# Patient Record
Sex: Female | Born: 1990 | Hispanic: Yes | Marital: Married | Smoking: Never smoker
Health system: Southern US, Community
[De-identification: ages and names within clinical notes are randomized; demographics above are authoritative.]

## PROBLEM LIST (undated history)

## (undated) DIAGNOSIS — Z789 Other specified health status: Secondary | ICD-10-CM

## (undated) HISTORY — PX: NO PAST SURGERIES: SHX2092

---
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
## 2012-12-16 ENCOUNTER — Ambulatory Visit: Payer: Self-pay | Admitting: Primary Care

## 2013-05-22 ENCOUNTER — Inpatient Hospital Stay: Payer: Self-pay | Admitting: Obstetrics and Gynecology

## 2013-05-22 LAB — CBC WITH DIFFERENTIAL/PLATELET
BASOS ABS: 0.1 10*3/uL (ref 0.0–0.1)
Basophil %: 0.5 %
EOS PCT: 0.1 %
Eosinophil #: 0 10*3/uL (ref 0.0–0.7)
HCT: 36 % (ref 35.0–47.0)
HGB: 11.8 g/dL — AB (ref 12.0–16.0)
LYMPHS ABS: 2.3 10*3/uL (ref 1.0–3.6)
Lymphocyte %: 20.7 %
MCH: 29.8 pg (ref 26.0–34.0)
MCHC: 32.7 g/dL (ref 32.0–36.0)
MCV: 91 fL (ref 80–100)
MONO ABS: 1 x10 3/mm — AB (ref 0.2–0.9)
MONOS PCT: 8.7 %
Neutrophil #: 7.9 10*3/uL — ABNORMAL HIGH (ref 1.4–6.5)
Neutrophil %: 70 %
PLATELETS: 230 10*3/uL (ref 150–440)
RBC: 3.95 10*6/uL (ref 3.80–5.20)
RDW: 14.9 % — ABNORMAL HIGH (ref 11.5–14.5)
WBC: 11.2 10*3/uL — ABNORMAL HIGH (ref 3.6–11.0)

## 2013-05-23 LAB — GC/CHLAMYDIA PROBE AMP

## 2013-05-24 LAB — HEMOGLOBIN: HGB: 9.6 g/dL — ABNORMAL LOW (ref 12.0–16.0)

## 2018-02-16 NOTE — L&D Delivery Note (Signed)
Delivery Note At 12:34 AM a viable and healthy female "Early Chars" was delivered via Vaginal, Spontaneous (Presentation: LOA  ).  APGAR: 8,9 ; weight pending .   Placenta status: spontaneous.  Cord: 3VC  with the following complications: none  Anesthesia:  None Episiotomy: None Lacerations: None Suture Repair: n/a Est. Blood Loss (mL):  100  Mom to postpartum.  Baby to Couplet care / Skin to Skin.  28yo [id number] at 38+2wks presented in active labor. She progressed to fully dilated after AROM for clear fluid and pushed over an intact perineum. She delivered a small baby who was immediately vigorous and was placed on maternal abdomen. Delayed cord clamping with a 3 vc. Placenta delivered intact. Bleeding minimal. No tears. Fundus firm. Baby and mom tolerated the procedure well.  Of note, we had no prenatal records on admission and by delivery. GBS rapid negative  Benjaman Kindler 11/15/2018, 12:44 AM

## 2018-04-13 ENCOUNTER — Other Ambulatory Visit: Payer: Self-pay | Admitting: Primary Care

## 2018-04-13 DIAGNOSIS — Z3201 Encounter for pregnancy test, result positive: Secondary | ICD-10-CM

## 2018-04-21 ENCOUNTER — Ambulatory Visit
Admission: RE | Admit: 2018-04-21 | Discharge: 2018-04-21 | Disposition: A | Discharge status: Home / Self Care | Payer: 59 | Source: Ambulatory Visit | Attending: Primary Care | Admitting: Primary Care

## 2018-04-21 ENCOUNTER — Ambulatory Visit: Payer: Self-pay

## 2018-04-21 DIAGNOSIS — Z3201 Encounter for pregnancy test, result positive: Secondary | ICD-10-CM | POA: Diagnosis present

## 2018-06-21 ENCOUNTER — Other Ambulatory Visit: Payer: Self-pay | Admitting: Primary Care

## 2018-06-21 DIAGNOSIS — Z3482 Encounter for supervision of other normal pregnancy, second trimester: Secondary | ICD-10-CM

## 2018-07-05 ENCOUNTER — Ambulatory Visit: Payer: 59

## 2018-07-07 ENCOUNTER — Ambulatory Visit
Admission: RE | Admit: 2018-07-07 | Discharge: 2018-07-07 | Disposition: A | Discharge status: Home / Self Care | Payer: 59 | Source: Ambulatory Visit | Attending: Primary Care | Admitting: Primary Care

## 2018-07-07 ENCOUNTER — Other Ambulatory Visit: Payer: Self-pay

## 2018-07-07 ENCOUNTER — Ambulatory Visit: Payer: 59

## 2018-07-07 DIAGNOSIS — Z3482 Encounter for supervision of other normal pregnancy, second trimester: Secondary | ICD-10-CM

## 2018-07-08 ENCOUNTER — Encounter: Payer: Self-pay | Admitting: Intensive Care

## 2018-07-08 ENCOUNTER — Emergency Department: Payer: 59

## 2018-07-08 ENCOUNTER — Other Ambulatory Visit: Payer: Self-pay

## 2018-07-08 ENCOUNTER — Emergency Department
Admission: EM | Admit: 2018-07-08 | Discharge: 2018-07-08 | Disposition: A | Discharge status: Home / Self Care | Payer: 59 | Attending: Student in an Organized Health Care Education/Training Program | Admitting: Student in an Organized Health Care Education/Training Program

## 2018-07-08 DIAGNOSIS — Z3A19 19 weeks gestation of pregnancy: Secondary | ICD-10-CM | POA: Insufficient documentation

## 2018-07-08 DIAGNOSIS — O9989 Other specified diseases and conditions complicating pregnancy, childbirth and the puerperium: Secondary | ICD-10-CM | POA: Insufficient documentation

## 2018-07-08 DIAGNOSIS — K8020 Calculus of gallbladder without cholecystitis without obstruction: Secondary | ICD-10-CM | POA: Diagnosis not present

## 2018-07-08 DIAGNOSIS — R1013 Epigastric pain: Secondary | ICD-10-CM | POA: Insufficient documentation

## 2018-07-08 LAB — COMPREHENSIVE METABOLIC PANEL
ALT: 19 U/L (ref 0–44)
AST: 21 U/L (ref 15–41)
Albumin: 3.9 g/dL (ref 3.5–5.0)
Alkaline Phosphatase: 79 U/L (ref 38–126)
Anion gap: 9 (ref 5–15)
BUN: 6 mg/dL (ref 6–20)
CO2: 24 mmol/L (ref 22–32)
Calcium: 9 mg/dL (ref 8.9–10.3)
Chloride: 102 mmol/L (ref 98–111)
Creatinine, Ser: 0.45 mg/dL (ref 0.44–1.00)
GFR calc Af Amer: >60 mL/min (ref >60)
GFR calc non Af Amer: >60 mL/min (ref >60)
Glucose, Bld: 93 mg/dL (ref 70–99)
Potassium: 3.6 mmol/L (ref 3.5–5.1)
Sodium: 135 mmol/L (ref 135–145)
Total Bilirubin: 0.3 mg/dL (ref 0.3–1.2)
Total Protein: 8.1 g/dL (ref 6.5–8.1)

## 2018-07-08 LAB — CBC
HCT: 36.3 % (ref 36.0–46.0)
Hemoglobin: 12.3 g/dL (ref 12.0–15.0)
MCH: 30 pg (ref 26.0–34.0)
MCHC: 33.9 g/dL (ref 30.0–36.0)
MCV: 88.5 fL (ref 80.0–100.0)
Platelets: 324 10*3/uL (ref 150–400)
RBC: 4.1 MIL/uL (ref 3.87–5.11)
RDW: 14.7 % (ref 11.5–15.5)
WBC: 15.7 10*3/uL — ABNORMAL HIGH (ref 4.0–10.5)
nRBC: 0 % (ref 0.0–0.2)

## 2018-07-08 LAB — URINALYSIS, COMPLETE (UACMP) WITH MICROSCOPIC
Bacteria, UA: NONE SEEN
Bilirubin Urine: NEGATIVE
Glucose, UA: NEGATIVE mg/dL
Hgb urine dipstick: NEGATIVE
Ketones, ur: 20 mg/dL — AB
Nitrite: NEGATIVE
Protein, ur: NEGATIVE mg/dL
Specific Gravity, Urine: 1.005 (ref 1.005–1.030)
pH: 7 (ref 5.0–8.0)

## 2018-07-08 LAB — LIPASE, BLOOD: Lipase: 48 U/L (ref 11–51)

## 2018-07-08 MED ORDER — SODIUM CHLORIDE 0.9 % IV BOLUS: 1000.0000 mL | Freq: Once | INTRAVENOUS | Status: DC

## 2018-07-08 MED ORDER — CEPHALEXIN 500 MG PO CAPS
500.0000 mg | ORAL_CAPSULE | Freq: Once | ORAL | Status: AC
  Administered 2018-07-08: 500 mg via ORAL
  Filled 2018-07-08: qty 1

## 2018-07-08 MED ORDER — CEPHALEXIN 500 MG PO CAPS: 500.0000 mg | ORAL_CAPSULE | Freq: Three times a day (TID) | ORAL | 0 refills | Status: AC

## 2018-07-08 NOTE — ED Provider Notes (Signed)
[facility] Emergency Department Provider Note    First MD Initiated Contact with Patient 07/08/18 1809     (approximate)  I have reviewed the triage vital signs and the nursing notes.   HISTORY  Chief Complaint Abdominal Pain and Near Syncope    HPI Lacey Clark is a 28 y.o. female roughly 5 months pregnant presents the ER for epigastric pain as well as near syncopal event that occurred while she was at work today.  States she felt like her heart was racing but did not fully lose consciousness.  Was complaining of epigastric discomfort and nausea.  Denies any shortness of breath.  No pain right now but has been having some nausea with meals over the past few days.  Denies any history of gastritis or heartburn.  Was also concerned because she felt the baby was not moving as much today.  Denies any dysuria or flank pain.    History reviewed. No pertinent past medical history. History reviewed. No pertinent family history. History reviewed. No pertinent surgical history. There are no active problems to display for this patient.     Prior to Admission medications   Medication Sig Start Date End Date Taking? Authorizing Provider  cephALEXin (KEFLEX) 500 MG capsule Take 1 capsule (500 mg total) by mouth 3 (three) times daily for 5 days. 07/08/18 07/13/18  Willy Eddy, MD    Allergies Patient has no known allergies.    Social History Social History   Tobacco Use  . Smoking status: Never Smoker  . Smokeless tobacco: Never Used  Substance Use Topics  . Alcohol use: Never    Frequency: Never  . Drug use: Never    Review of Systems Patient denies headaches, rhinorrhea, blurry vision, numbness, shortness of breath, chest pain, edema, cough, abdominal pain, nausea, vomiting, diarrhea, dysuria, fevers, rashes or hallucinations unless otherwise stated above in HPI. ____________________________________________   PHYSICAL EXAM:  VITAL  SIGNS: Vitals:   07/08/18 1830 07/08/18 1933  BP: 105/66 101/63  Pulse: 84 79  Resp: (!) 22 20  Temp:    SpO2: 100% 100%    Constitutional: Alert and oriented.  Eyes: Conjunctivae are normal.  Head: Atraumatic. Nose: No congestion/rhinnorhea. Mouth/Throat: Mucous membranes are moist.   Neck: No stridor. Painless ROM.  Cardiovascular: Normal rate, regular rhythm. Grossly normal heart sounds.  Good peripheral circulation. Respiratory: Normal respiratory effort.  No retractions. Lungs CTAB. Gastrointestinal: Soft and nontender gravid. No distention. No abdominal bruits. No CVA tenderness. Genitourinary:  Musculoskeletal: No lower extremity tenderness nor edema.  No joint effusions. Neurologic:  Normal speech and language. No gross focal neurologic deficits are appreciated. No facial droop Skin:  Skin is warm, dry and intact. No rash noted. Psychiatric: Mood and affect are normal. Speech and behavior are normal.  ____________________________________________   LABS (all labs ordered are listed, but only abnormal results are displayed)  Results for orders placed or performed during the hospital encounter of 07/08/18 (from the past 24 hour(s))  Lipase, blood     Status: None   Collection Time: 07/08/18  4:45 PM  Result Value Ref Range   Lipase 48 11 - 51 U/L  Comprehensive metabolic panel     Status: None   Collection Time: 07/08/18  4:45 PM  Result Value Ref Range   Sodium 135 135 - 145 mmol/L   Potassium 3.6 3.5 - 5.1 mmol/L   Chloride 102 98 - 111 mmol/L   CO2 24 22 - 32 mmol/L  Glucose, Bld 93 70 - 99 mg/dL   BUN 6 6 - 20 mg/dL   Creatinine, Ser 1.61 0.44 - 1.00 mg/dL   Calcium 9.0 8.9 - 09.6 mg/dL   Total Protein 8.1 6.5 - 8.1 g/dL   Albumin 3.9 3.5 - 5.0 g/dL   AST 21 15 - 41 U/L   ALT 19 0 - 44 U/L   Alkaline Phosphatase 79 38 - 126 U/L   Total Bilirubin 0.3 0.3 - 1.2 mg/dL   GFR calc non Af Amer >60 >60 mL/min   GFR calc Af Amer >60 >60 mL/min   Anion gap 9 5 -  15  CBC     Status: Abnormal   Collection Time: 07/08/18  4:45 PM  Result Value Ref Range   WBC 15.7 (H) 4.0 - 10.5 K/uL   RBC 4.10 3.87 - 5.11 MIL/uL   Hemoglobin 12.3 12.0 - 15.0 g/dL   HCT 04.5 40.9 - 81.1 %   MCV 88.5 80.0 - 100.0 fL   MCH 30.0 26.0 - 34.0 pg   MCHC 33.9 30.0 - 36.0 g/dL   RDW 91.4 78.2 - 95.6 %   Platelets 324 150 - 400 K/uL   nRBC 0.0 0.0 - 0.2 %  Urinalysis, Complete w Microscopic     Status: Abnormal   Collection Time: 07/08/18  7:34 PM  Result Value Ref Range   Color, Urine YELLOW (A) YELLOW   APPearance HAZY (A) CLEAR   Specific Gravity, Urine 1.005 1.005 - 1.030   pH 7.0 5.0 - 8.0   Glucose, UA NEGATIVE NEGATIVE mg/dL   Hgb urine dipstick NEGATIVE NEGATIVE   Bilirubin Urine NEGATIVE NEGATIVE   Ketones, ur 20 (A) NEGATIVE mg/dL   Protein, ur NEGATIVE NEGATIVE mg/dL   Nitrite NEGATIVE NEGATIVE   Leukocytes,Ua LARGE (A) NEGATIVE   RBC / HPF 0-5 0 - 5 RBC/hpf   WBC, UA 11-20 0 - 5 WBC/hpf   Bacteria, UA NONE SEEN NONE SEEN   Squamous Epithelial / LPF 11-20 0 - 5   ____________________________________________  EKG My review and personal interpretation at Time: 18:34   Indication: epigastric pain  Rate: 70  Rhythm: sinus Axis: normal Other: normal intervals, no stemi ____________________________________________  RADIOLOGY  I personally reviewed all radiographic images ordered to evaluate for the above acute complaints and reviewed radiology reports and findings.  These findings were personally discussed with the patient.  Please see medical record for radiology report.  EMERGENCY DEPARTMENT [place] PREGNANCY "Study: Limited Ultrasound of the Pelvis for Pregnancy"  INDICATIONS:Pregnancy(required) and Abdominal or pelvic pain Multiple views of the uterus and pelvic cavity were obtained in real-time with a multi-frequency probe.  APPROACH:Transabdominal  PERFORMED BY: Myself IMAGES ARCHIVED?: No LIMITATIONS: none PREGNANCY FREE FLUID: Present  ADNEXAL FINDINGS: GESTATIONAL AGE, ESTIMATE: 39mo FETAL HEART RATE: 150 INTERPRETATION: reassuring fetal US with reassuring fetal movement     ____________________________________________   PROCEDURES  Procedure(s) performed:  Procedures    Critical Care performed: no ____________________________________________   INITIAL IMPRESSION / ASSESSMENT AND PLAN / ED COURSE  Pertinent labs & imaging results that were available during my care of the patient were reviewed by me and considered in my medical decision making (see chart for details).   DDX: gastrit,s dehydration, dysrhythmia, enteritis, cholelithiasis, cholecystitis  Lacey Clark is a 28 y.o. who presents to the ED with symptoms as described above.  Reassuring exam.  Reassuring fetal heart tones and reassuring fetal movement the ultrasound.  EKG does not show any  evidence of dysrhythmia.  Will give little IV fluids might have a component of dehydration.  No right lower quadrant pain to suggest appendicitis.  Will check urine.  We will also order ultrasound to evaluate for acute cholelithiasis or cholecystitis.  Clinical Course as of Jul 08 2103  Fri Jul 08, 2018  2029 Ultrasound shows evidence of cholelithiasis but no evidence of cholecystitis.  Patient's symptoms improved.  Does have some pyuria therefore will starting Keflex.  Patient tolerating oral hydration.  Stable appropriate for outpatient follow-up.   [PR]    Clinical Course User Index [PR] Willy Eddyobinson, Della Homan, MD    The patient was evaluated in Emergency Department today for the symptoms described in the history of present illness. He/she was evaluated in the context of the global COVID-19 pandemic, which necessitated consideration that the patient might be at risk for infection with the SARS-CoV-2 virus that causes COVID-19. Institutional protocols and algorithms that pertain to the evaluation of patients at risk for COVID-19 are in a state of rapid change  based on information released by regulatory bodies including the CDC and federal and state organizations. These policies and algorithms were followed during the patient's care in the ED.  As part of my medical decision making, I reviewed the following data within the electronic MEDICAL RECORD NUMBER Nursing notes reviewed and incorporated, Labs reviewed, notes from prior ED visits and [place] Controlled Substance Database   ____________________________________________   FINAL CLINICAL IMPRESSION(S) / ED DIAGNOSES  Final diagnoses:  Epigastric pain  [redacted] weeks gestation of pregnancy  Calculus of gallbladder without cholecystitis without obstruction      NEW MEDICATIONS STARTED DURING THIS VISIT:  New Prescriptions   CEPHALEXIN (KEFLEX) 500 MG CAPSULE    Take 1 capsule (500 mg total) by mouth 3 (three) times daily for 5 days.     Note:  This document was prepared using Dragon voice recognition software and may include unintentional dictation errors.    Willy Eddyobinson, Jamine Highfill, MD 07/08/18 2105

## 2018-07-08 NOTE — Discharge Instructions (Addendum)

## 2018-07-08 NOTE — ED Notes (Signed)
Patient transported to Ultrasound 

## 2018-07-08 NOTE — ED Triage Notes (Signed)
First Nurse Note:  Arrives via [facility] for ED evaluation.  EMS reports that patient is [redacted] weeks pregnant.  C/O epigastric pain x 45 minutes also decreased fetal movement today.  Had [place] yesterday at [facility], no problems identified.  Wants to be checked out today for decreased fetal movement.  Patient is G3 P2.  Denies vaginal bleeding, trauma, abdominal pain.

## 2018-07-08 NOTE — ED Triage Notes (Signed)
Patient reports around 3pm she started feeling near syncope and abdominal pain while at work. After sitting for awhile she went to stand and the pain got more severe in her abdomen. Patient is 19 weeks and is seen by Leonette Most drew. Denies any discharge.

## 2018-07-10 LAB — URINE CULTURE

## 2018-10-18 ENCOUNTER — Encounter: Payer: Self-pay | Admitting: *Deleted

## 2018-10-18 ENCOUNTER — Observation Stay
Admission: EM | Admit: 2018-10-18 | Discharge: 2018-10-18 | Disposition: A | Discharge status: Home / Self Care | Payer: 59 | Attending: Obstetrics and Gynecology | Admitting: Obstetrics and Gynecology

## 2018-10-18 ENCOUNTER — Other Ambulatory Visit: Payer: Self-pay

## 2018-10-18 DIAGNOSIS — Z3A34 34 weeks gestation of pregnancy: Secondary | ICD-10-CM | POA: Insufficient documentation

## 2018-10-18 DIAGNOSIS — U071 COVID-19: Secondary | ICD-10-CM | POA: Insufficient documentation

## 2018-10-18 DIAGNOSIS — O98513 Other viral diseases complicating pregnancy, third trimester: Secondary | ICD-10-CM | POA: Diagnosis not present

## 2018-10-18 DIAGNOSIS — O26893 Other specified pregnancy related conditions, third trimester: Secondary | ICD-10-CM | POA: Diagnosis present

## 2018-10-18 DIAGNOSIS — Z3490 Encounter for supervision of normal pregnancy, unspecified, unspecified trimester: Secondary | ICD-10-CM

## 2018-10-18 DIAGNOSIS — R112 Nausea with vomiting, unspecified: Secondary | ICD-10-CM | POA: Diagnosis present

## 2018-10-18 LAB — SARS CORONAVIRUS 2 BY RT PCR (HOSPITAL ORDER, PERFORMED IN ~~LOC~~ HOSPITAL LAB): SARS Coronavirus 2: POSITIVE — AB

## 2018-10-18 LAB — CBC
HCT: 35.9 % — ABNORMAL LOW (ref 36.0–46.0)
Hemoglobin: 12.3 g/dL (ref 12.0–15.0)
MCH: 30.1 pg (ref 26.0–34.0)
MCHC: 34.3 g/dL (ref 30.0–36.0)
MCV: 87.8 fL (ref 80.0–100.0)
Platelets: 192 10*3/uL (ref 150–400)
RBC: 4.09 MIL/uL (ref 3.87–5.11)
RDW: 14.5 % (ref 11.5–15.5)
WBC: 7.1 10*3/uL (ref 4.0–10.5)
nRBC: 0 % (ref 0.0–0.2)

## 2018-10-18 LAB — URINALYSIS, COMPLETE (UACMP) WITH MICROSCOPIC
Bacteria, UA: NONE SEEN
Bilirubin Urine: NEGATIVE
Glucose, UA: NEGATIVE mg/dL
Hgb urine dipstick: NEGATIVE
Ketones, ur: 80 mg/dL — AB
Nitrite: NEGATIVE
Protein, ur: 30 mg/dL — AB
Specific Gravity, Urine: 1.019 (ref 1.005–1.030)
pH: 6 (ref 5.0–8.0)

## 2018-10-18 LAB — COMPREHENSIVE METABOLIC PANEL
ALT: 27 U/L (ref 0–44)
AST: 30 U/L (ref 15–41)
Albumin: 2.9 g/dL — ABNORMAL LOW (ref 3.5–5.0)
Alkaline Phosphatase: 128 U/L — ABNORMAL HIGH (ref 38–126)
Anion gap: 9 (ref 5–15)
BUN: 6 mg/dL (ref 6–20)
CO2: 21 mmol/L — ABNORMAL LOW (ref 22–32)
Calcium: 8.4 mg/dL — ABNORMAL LOW (ref 8.9–10.3)
Chloride: 104 mmol/L (ref 98–111)
Creatinine, Ser: 0.47 mg/dL (ref 0.44–1.00)
GFR calc Af Amer: >60 mL/min (ref >60)
GFR calc non Af Amer: >60 mL/min (ref >60)
Glucose, Bld: 92 mg/dL (ref 70–99)
Potassium: 3.6 mmol/L (ref 3.5–5.1)
Sodium: 134 mmol/L — ABNORMAL LOW (ref 135–145)
Total Bilirubin: 0.6 mg/dL (ref 0.3–1.2)
Total Protein: 6.4 g/dL — ABNORMAL LOW (ref 6.5–8.1)

## 2018-10-18 LAB — LIPASE, BLOOD: Lipase: 39 U/L (ref 11–51)

## 2018-10-18 MED ORDER — DEXTROSE 5 % AND 0.9 % NACL IV BOLUS
1000.0000 mL | Freq: Once | INTRAVENOUS | Status: AC
  Administered 2018-10-18: 1000 mL via INTRAVENOUS

## 2018-10-18 MED ORDER — ONDANSETRON HCL 4 MG PO TABS
4.0000 mg | ORAL_TABLET | Freq: Once | ORAL | Status: AC
  Administered 2018-10-18: 4 mg via ORAL
  Filled 2018-10-18: qty 1

## 2018-10-18 MED ORDER — DEXTROSE 5 % AND 0.9 % NACL IV BOLUS: 250.0000 mL | Freq: Once | INTRAVENOUS | Status: DC

## 2018-10-18 MED ORDER — ONDANSETRON 4 MG PO TBDP: 4.0000 mg | ORAL_TABLET | Freq: Four times a day (QID) | ORAL | 0 refills | Status: DC | PRN

## 2018-10-18 MED ORDER — ONDANSETRON 4 MG PO TBDP: 4.0000 mg | ORAL_TABLET | Freq: Four times a day (QID) | ORAL | Status: DC | PRN

## 2018-10-18 NOTE — ED Triage Notes (Signed)
Per patient's report, patient began vomiting after eating anything two days ago. Patient states she started to feel dizzy yesterday. Patient states she is [redacted] weeks pregnant and is feeling the baby move normally. Patient states she has had low iron with this pregnancy.

## 2018-10-18 NOTE — OB Triage Note (Signed)
Pt presents to the ED c/o N/V dizziness, and unable to keep food down since Sunday 9/30. Pt also states she has mild mid back pain and a cough that started on Monday 8/31. Pt denies vaginal bleeding, leaking of fluid, and states positive fetal movement. VS WNL. External monitors applied, initial FHT 155.

## 2018-10-18 NOTE — ED Notes (Signed)
L&D called per Dr. Archie Balboa for monitoring. Patient will go to L&D Obs 1 for monitoring first.

## 2018-10-18 NOTE — ED Triage Notes (Signed)
TRIAGE VISIT with NST   Lacey Clark is a 28 y.o. G3P0. She is at 6763w2d gestation.  Indication: N/V dizziness, anorexia, cough  S: Resting comfortably. no CTX, no VB. Active fetal movement. O:  BP (!) 93/54 (BP Location: Left Arm)   Pulse 95   Temp 98.9 F (37.2 C) (Oral)   Resp 20   Ht 5\' 3"  (1.6 m)   Wt 68 kg   SpO2 97%   BMI 26.57 kg/m  Results for orders placed or performed during the hospital encounter of 10/18/18 (from the past 48 hour(s))  Lipase, blood   Collection Time: 10/18/18  4:55 PM  Result Value Ref Range   Lipase 39 11 - 51 U/L  Comprehensive metabolic panel   Collection Time: 10/18/18  4:55 PM  Result Value Ref Range   Sodium 134 (L) 135 - 145 mmol/L   Potassium 3.6 3.5 - 5.1 mmol/L   Chloride 104 98 - 111 mmol/L   CO2 21 (L) 22 - 32 mmol/L   Glucose, Bld 92 70 - 99 mg/dL   BUN 6 6 - 20 mg/dL   Creatinine, Ser 7.820.47 0.44 - 1.00 mg/dL   Calcium 8.4 (L) 8.9 - 10.3 mg/dL   Total Protein 6.4 (L) 6.5 - 8.1 g/dL   Albumin 2.9 (L) 3.5 - 5.0 g/dL   AST 30 15 - 41 U/L   ALT 27 0 - 44 U/L   Alkaline Phosphatase 128 (H) 38 - 126 U/L   Total Bilirubin 0.6 0.3 - 1.2 mg/dL   GFR calc non Af Amer >60 >60 mL/min   GFR calc Af Amer >60 >60 mL/min   Anion gap 9 5 - 15  CBC   Collection Time: 10/18/18  4:55 PM  Result Value Ref Range   WBC 7.1 4.0 - 10.5 K/uL   RBC 4.09 3.87 - 5.11 MIL/uL   Hemoglobin 12.3 12.0 - 15.0 g/dL   HCT 95.635.9 (L) 21.336.0 - 08.646.0 %   MCV 87.8 80.0 - 100.0 fL   MCH 30.1 26.0 - 34.0 pg   MCHC 34.3 30.0 - 36.0 g/dL   RDW 57.814.5 46.911.5 - 62.915.5 %   Platelets 192 150 - 400 K/uL   nRBC 0.0 0.0 - 0.2 %  Urinalysis, Complete w Microscopic   Collection Time: 10/18/18  4:55 PM  Result Value Ref Range   Color, Urine YELLOW (A) YELLOW   APPearance HAZY (A) CLEAR   Specific Gravity, Urine 1.019 1.005 - 1.030   pH 6.0 5.0 - 8.0   Glucose, UA NEGATIVE NEGATIVE mg/dL   Hgb urine dipstick NEGATIVE NEGATIVE   Bilirubin Urine NEGATIVE NEGATIVE   Ketones, ur 80 (A) NEGATIVE mg/dL   Protein, ur 30 (A) NEGATIVE mg/dL   Nitrite NEGATIVE NEGATIVE   Leukocytes,Ua MODERATE (A) NEGATIVE   RBC / HPF 0-5 0 - 5 RBC/hpf   WBC, UA 0-5 0 - 5 WBC/hpf   Bacteria, UA NONE SEEN NONE SEEN   Squamous Epithelial / LPF 0-5 0 - 5   Mucus PRESENT   SARS Coronavirus 2 [facility](Hospital order, Performed [facility]) Nasopharyngeal Nasopharyngeal Swab   Collection Time: 10/18/18  6:40 PM   Specimen: Nasopharyngeal Swab  Result Value Ref Range   SARS Coronavirus 2 POSITIVE (A) NEGATIVE     Gen: NAD, AAOx3      Abd: FNTTP      Ext: Non-tender, Nonedmeatous    FHT: 150, mod var, +10x10 accels, no decels TOCO: quiet SVE:  deferred   A/P:  28 y.o. G3P0 [redacted]w[redacted]d with covid.   Labor: not present.   Covid positive: Quarantine for 14 days. Family contacts quarantine for 14 days. Call health department in am for guidelines. Call us back if decreased fetal movement, persistently elevated temp, or other obstetric concerns.  Will send in zofran  Fetal Wellbeing: NST is Reassuring Cat 1 tracing.  D/c home stable, precautions reviewed, follow-up as scheduled.

## 2018-10-31 ENCOUNTER — Other Ambulatory Visit: Payer: Self-pay | Admitting: *Deleted

## 2018-10-31 DIAGNOSIS — Z20822 Contact with and (suspected) exposure to covid-19: Secondary | ICD-10-CM

## 2018-11-01 LAB — NOVEL CORONAVIRUS, NAA: SARS-CoV-2, NAA: NOT DETECTED

## 2018-11-03 ENCOUNTER — Other Ambulatory Visit: Payer: Self-pay

## 2018-11-03 DIAGNOSIS — Z20822 Contact with and (suspected) exposure to covid-19: Secondary | ICD-10-CM

## 2018-11-04 LAB — NOVEL CORONAVIRUS, NAA: SARS-CoV-2, NAA: NOT DETECTED

## 2018-11-07 ENCOUNTER — Telehealth: Payer: Self-pay | Admitting: General Practice

## 2018-11-07 NOTE — Telephone Encounter (Signed)
Gave patient negtive test results Patient understood

## 2018-11-14 ENCOUNTER — Other Ambulatory Visit: Payer: Self-pay

## 2018-11-14 ENCOUNTER — Inpatient Hospital Stay
Admission: EM | Admit: 2018-11-14 | Discharge: 2018-11-16 | DRG: 807 | Disposition: A | Discharge status: Home / Self Care | Payer: 59 | Attending: Obstetrics and Gynecology | Admitting: Obstetrics and Gynecology

## 2018-11-14 DIAGNOSIS — O26893 Other specified pregnancy related conditions, third trimester: Secondary | ICD-10-CM | POA: Diagnosis present

## 2018-11-14 DIAGNOSIS — Z86001 Personal history of in-situ neoplasm of cervix uteri: Secondary | ICD-10-CM

## 2018-11-14 DIAGNOSIS — Z3A38 38 weeks gestation of pregnancy: Secondary | ICD-10-CM

## 2018-11-14 DIAGNOSIS — Z3490 Encounter for supervision of normal pregnancy, unspecified, unspecified trimester: Secondary | ICD-10-CM

## 2018-11-14 HISTORY — DX: Other specified health status: Z78.9

## 2018-11-14 LAB — TYPE AND SCREEN
ABO/RH(D): O POS
Antibody Screen: NEGATIVE

## 2018-11-14 LAB — CBC
HCT: 34.7 % — ABNORMAL LOW (ref 36.0–46.0)
Hemoglobin: 11.6 g/dL — ABNORMAL LOW (ref 12.0–15.0)
MCH: 30 pg (ref 26.0–34.0)
MCHC: 33.4 g/dL (ref 30.0–36.0)
MCV: 89.7 fL (ref 80.0–100.0)
Platelets: 217 10*3/uL (ref 150–400)
RBC: 3.87 MIL/uL (ref 3.87–5.11)
RDW: 15.1 % (ref 11.5–15.5)
WBC: 8.6 10*3/uL (ref 4.0–10.5)
nRBC: 0 % (ref 0.0–0.2)

## 2018-11-14 LAB — GROUP B STREP BY PCR: Group B strep by PCR: NEGATIVE

## 2018-11-14 MED ORDER — MISOPROSTOL 200 MCG PO TABS
ORAL_TABLET | ORAL | Status: AC
  Filled 2018-11-14: qty 4

## 2018-11-14 MED ORDER — SOD CITRATE-CITRIC ACID 500-334 MG/5ML PO SOLN: 30.0000 mL | ORAL | Status: DC | PRN

## 2018-11-14 MED ORDER — BUTORPHANOL TARTRATE 1 MG/ML IJ SOLN: 1.0000 mg | INTRAMUSCULAR | Status: DC | PRN

## 2018-11-14 MED ORDER — OXYTOCIN 40 UNITS IN NORMAL SALINE INFUSION - SIMPLE MED
1.0000 m[IU]/min | INTRAVENOUS | Status: DC
  Administered 2018-11-14: 23:00:00 2 m[IU]/min via INTRAVENOUS

## 2018-11-14 MED ORDER — ONDANSETRON HCL 4 MG/2ML IJ SOLN: 4.0000 mg | Freq: Four times a day (QID) | INTRAMUSCULAR | Status: DC | PRN

## 2018-11-14 MED ORDER — LIDOCAINE HCL (PF) 1 % IJ SOLN: 30.0000 mL | INTRAMUSCULAR | Status: DC | PRN

## 2018-11-14 MED ORDER — OXYTOCIN 40 UNITS IN NORMAL SALINE INFUSION - SIMPLE MED
2.5000 [IU]/h | INTRAVENOUS | Status: DC
  Filled 2018-11-14: qty 1000

## 2018-11-14 MED ORDER — LACTATED RINGERS IV SOLN
INTRAVENOUS | Status: DC
  Administered 2018-11-14: 20:00:00 via INTRAVENOUS

## 2018-11-14 MED ORDER — OXYTOCIN BOLUS FROM INFUSION
500.0000 mL | Freq: Once | INTRAVENOUS | Status: AC
  Administered 2018-11-15: 500 mL via INTRAVENOUS

## 2018-11-14 MED ORDER — LACTATED RINGERS IV SOLN: 500.0000 mL | INTRAVENOUS | Status: DC | PRN

## 2018-11-14 MED ORDER — ACETAMINOPHEN 325 MG PO TABS: 650.0000 mg | ORAL_TABLET | ORAL | Status: DC | PRN

## 2018-11-14 MED ORDER — AMMONIA AROMATIC IN INHA
RESPIRATORY_TRACT | Status: AC
  Filled 2018-11-14: qty 10

## 2018-11-14 MED ORDER — OXYTOCIN 10 UNIT/ML IJ SOLN
INTRAMUSCULAR | Status: AC
  Filled 2018-11-14: qty 2

## 2018-11-14 MED ORDER — TERBUTALINE SULFATE 1 MG/ML IJ SOLN: 0.2500 mg | Freq: Once | INTRAMUSCULAR | Status: DC | PRN

## 2018-11-14 MED ORDER — LIDOCAINE HCL (PF) 1 % IJ SOLN
INTRAMUSCULAR | Status: AC
  Filled 2018-11-14: qty 30

## 2018-11-14 NOTE — Progress Notes (Signed)
Lacey Clark is a 28 y.o. G3P2002 at [redacted]w[redacted]d   Subjective: Contractions now 8 out of 10  Objective: BP 112/66 (BP Location: Left Arm)   Pulse 63   Temp 98.1 F (36.7 C) (Oral)   Resp 16   Ht 5\' 5"  (1.651 m)   Wt 68.5 kg   BMI 25.13 kg/m  No intake/output data recorded. No intake/output data recorded.  FHT:  FHR: 130 bpm, variability: moderate,  accelerations:  Present,  decelerations:  Absent UC:   regular, every 5 minutes SVE:   Dilation: 5 Effacement (%): 70 Station: -2 Exam by:: Leafy Ro MD  Labs: Lab Results  Component Value Date   WBC 8.6 11/14/2018   HGB 11.6 (L) 11/14/2018   HCT 34.7 (L) 11/14/2018   MCV 89.7 11/14/2018   PLT 217 11/14/2018    Assessment / Plan: Spontaneous labor, progressing normally  Labor: Progressing normally AROM for clear fluid Cat I strip Anticipated MOD:  NSVD  Benjaman Kindler 11/14/2018, 10:29 PM

## 2018-11-14 NOTE — OB Triage Note (Signed)
Pt is a G3P2 at [redacted]w[redacted]d that presents from ED with c/o pelvic pressure and "Feeling like I have to poop when baby moves." Pt denies VB, LOF and states positive FM. Monitors applied and intial fht 125. Will continue to monitor

## 2018-11-14 NOTE — H&P (Signed)
OB ADMISSION/ HISTORY & PHYSICAL:  Admission Date: 11/14/2018  4:10 PM  Admit Diagnosis: Contractions   Lacey Clark is a 28 y.o. G3P2002 at 38+1 presenting for active labor with cervical change in triage.  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 Other babies are now 70 and 66 yrs old. Largest baby was 6#2oz.   Medical / Surgical History :  Past medical history:  Past Medical History:  Diagnosis Date  . Medical history non-contributory      Past surgical history:  Past Surgical History:  Procedure Laterality Date  . NO PAST SURGERIES      Family History: History reviewed. No pertinent family history.   Social History:  reports that she has never smoked. She has never used smokeless tobacco. She reports that she does not drink alcohol or use drugs.   Allergies: Patient has no known allergies.    Current Medications at time of admission:  Prior to Admission medications   Medication Sig Start Date End Date Taking? Authorizing Provider  ferrous sulfate 324 MG TBEC Take 324 mg by mouth.   Yes [provider]  Prenatal Vit-Fe Fumarate-FA (MULTIVITAMIN-PRENATAL) 27-0.8 MG TABS tablet Take 1 tablet by mouth daily at 12 noon.   Yes [provider]  ondansetron (ZOFRAN-ODT) 4 MG disintegrating tablet Take 1 tablet (4 mg total) by mouth every 6 (six) hours as needed for nausea or vomiting. Patient not taking: Reported on 11/14/2018 10/18/18   Benjaman Kindler, MD     Review of Systems: Active FM onset of ctx this morning, currently every 3-4 minutes LOF  / SROM: no bloody show no   Physical Exam:  VS: Blood pressure 115/67, pulse 67, temperature 98.6 F (37 C), temperature source Oral, height 5'  5" (1.651 m), weight 68.5 kg.  General: alert and oriented, appears NAD Heart: RRR Lungs: Clear lung fields Abdomen: Gravid, soft and non-tender, non-distended Extremities: trace edema  FHT: 140, moderate variability, +accels, no decels TOCO: SVE:  Dilation: 4.5 / Effacement (%): 70 / Station: -2    Cephalic by leopolds  Prenatal Labs: Blood type/Rh    No prenatal records found                                     No results found.  Assessment: [redacted]w[redacted]d weeks gestation 1 stage of labor FHR category 1   Plan:   Admit for active labor Labs pending Epidural when desired Continuous fetal monitoring   1. Fetal Well being  - Fetal Tracing: Cat I - Ultrasound: we have no records - Group B Streptococcus: unknown - Presentation: vtx confirmed by Leopolds   2. Routine OB: - Prenatal labs reviewed, as above - Rh pending

## 2018-11-15 LAB — CHLAMYDIA/NGC RT PCR (ARMC ONLY)
Chlamydia Tr: NOT DETECTED
N gonorrhoeae: NOT DETECTED

## 2018-11-15 LAB — CBC
HCT: 32.3 % — ABNORMAL LOW (ref 36.0–46.0)
Hemoglobin: 11 g/dL — ABNORMAL LOW (ref 12.0–15.0)
MCH: 30.2 pg (ref 26.0–34.0)
MCHC: 34.1 g/dL (ref 30.0–36.0)
MCV: 88.7 fL (ref 80.0–100.0)
Platelets: 205 10*3/uL (ref 150–400)
RBC: 3.64 MIL/uL — ABNORMAL LOW (ref 3.87–5.11)
RDW: 15.1 % (ref 11.5–15.5)
WBC: 16.7 10*3/uL — ABNORMAL HIGH (ref 4.0–10.5)
nRBC: 0 % (ref 0.0–0.2)

## 2018-11-15 MED ORDER — ONDANSETRON HCL 4 MG/2ML IJ SOLN: 4.0000 mg | INTRAMUSCULAR | Status: DC | PRN

## 2018-11-15 MED ORDER — TETANUS-DIPHTH-ACELL PERTUSSIS 5-2.5-18.5 LF-MCG/0.5 IM SUSP: 0.5000 mL | Freq: Once | INTRAMUSCULAR | Status: DC

## 2018-11-15 MED ORDER — OXYCODONE HCL 5 MG PO TABS: 5.0000 mg | ORAL_TABLET | ORAL | Status: DC | PRN

## 2018-11-15 MED ORDER — SIMETHICONE 80 MG PO CHEW: 80.0000 mg | CHEWABLE_TABLET | ORAL | Status: DC | PRN

## 2018-11-15 MED ORDER — MEASLES, MUMPS & RUBELLA VAC IJ SOLR
0.5000 mL | Freq: Once | INTRAMUSCULAR | Status: DC
  Filled 2018-11-15: qty 0.5

## 2018-11-15 MED ORDER — IBUPROFEN 600 MG PO TABS
600.0000 mg | ORAL_TABLET | Freq: Four times a day (QID) | ORAL | Status: DC
  Administered 2018-11-15 – 2018-11-16 (×7): 600 mg via ORAL
  Filled 2018-11-15 (×7): qty 1

## 2018-11-15 MED ORDER — DIPHENHYDRAMINE HCL 25 MG PO CAPS: 25.0000 mg | ORAL_CAPSULE | Freq: Four times a day (QID) | ORAL | Status: DC | PRN

## 2018-11-15 MED ORDER — ONDANSETRON HCL 4 MG PO TABS: 4.0000 mg | ORAL_TABLET | ORAL | Status: DC | PRN

## 2018-11-15 MED ORDER — ACETAMINOPHEN 325 MG PO TABS
650.0000 mg | ORAL_TABLET | ORAL | Status: DC | PRN
  Administered 2018-11-15: 650 mg via ORAL
  Filled 2018-11-15: qty 2

## 2018-11-15 MED ORDER — BISACODYL 10 MG RE SUPP: 10.0000 mg | Freq: Every day | RECTAL | Status: DC | PRN

## 2018-11-15 MED ORDER — SODIUM CHLORIDE 0.9 % IV SOLN: 250.0000 mL | INTRAVENOUS | Status: DC | PRN

## 2018-11-15 MED ORDER — DIBUCAINE (PERIANAL) 1 % EX OINT: 1.0000 "application " | TOPICAL_OINTMENT | CUTANEOUS | Status: DC | PRN

## 2018-11-15 MED ORDER — FLEET ENEMA 7-19 GM/118ML RE ENEM: 1.0000 | ENEMA | Freq: Every day | RECTAL | Status: DC | PRN

## 2018-11-15 MED ORDER — ZOLPIDEM TARTRATE 5 MG PO TABS: 5.0000 mg | ORAL_TABLET | Freq: Every evening | ORAL | Status: DC | PRN

## 2018-11-15 MED ORDER — COCONUT OIL OIL: 1.0000 "application " | TOPICAL_OIL | Status: DC | PRN

## 2018-11-15 MED ORDER — SENNOSIDES-DOCUSATE SODIUM 8.6-50 MG PO TABS
2.0000 | ORAL_TABLET | Freq: Every day | ORAL | Status: DC
  Administered 2018-11-15: 2 via ORAL
  Filled 2018-11-15: qty 2

## 2018-11-15 MED ORDER — PRENATAL MULTIVITAMIN CH
1.0000 | ORAL_TABLET | Freq: Every day | ORAL | Status: DC
  Administered 2018-11-15 – 2018-11-16 (×2): 1 via ORAL
  Filled 2018-11-15 (×2): qty 1

## 2018-11-15 MED ORDER — SODIUM CHLORIDE 0.9% FLUSH: 3.0000 mL | Freq: Two times a day (BID) | INTRAVENOUS | Status: DC

## 2018-11-15 MED ORDER — BENZOCAINE-MENTHOL 20-0.5 % EX AERO
1.0000 "application " | INHALATION_SPRAY | CUTANEOUS | Status: DC | PRN
  Administered 2018-11-15: 1 via TOPICAL
  Filled 2018-11-15: qty 56

## 2018-11-15 MED ORDER — WITCH HAZEL-GLYCERIN EX PADS: 1.0000 "application " | MEDICATED_PAD | CUTANEOUS | Status: DC | PRN

## 2018-11-15 MED ORDER — SODIUM CHLORIDE 0.9% FLUSH: 3.0000 mL | INTRAVENOUS | Status: DC | PRN

## 2018-11-15 NOTE — Discharge Summary (Signed)
Obstetrical Discharge Summary  Patient Name: Lacey Clark DOB: [birth year] MRN: [id number]  Date of Admission: 11/14/2018 Date of Discharge: 11/16/2018  Primary OB:  Phineas Real   Gestational Age at Delivery: [redacted]w[redacted]d   Antepartum complications: None  Admitting Diagnosis: active labor Secondary Diagnosis: No prenatal records on admission Patient Active Problem List   Diagnosis Date Noted  . Indication for care in labor and delivery, antepartum 11/14/2018  . Pregnancy 10/18/2018    Augmentation: AROM Complications: None Intrapartum complications/course: uncomplicated unmedicated NSVD for small baby with Apgars 8,9 Date of Delivery: 11/15/2018 Delivered By: Christeen Douglas Delivery Type: spontaneous vaginal delivery Anesthesia: none Placenta: Spontaneous Laceration: none Episiotomy: none Newborn Data: Live born female "Lacey Clark" Birth Weight:  5#4 APGAR:8 ,9   Newborn Delivery   Birth date/time: 11/15/2018 00:34:00 Delivery type: Vaginal, Spontaneous       Brief Hospital Course  Lacey Clark is a [id number] and delivered vaginally.  Patient had an uncomplicated postpartum course.  By time of discharge on PPD#1, her pain was controlled on oral pain medications; she had appropriate lochia and was ambulating, voiding without difficulty and tolerating regular diet.  She was deemed stable for discharge to home.      Discharge Physical Exam:  BP 103/63 (BP Location: Left Arm)   Pulse 77   Temp 98.9 F (37.2 C) (Oral)   Resp 18   Ht 5\' 5"  (1.651 m)   Wt 68.5 kg   SpO2 99%   Breastfeeding Unknown   BMI 25.13 kg/m   General: NAD CV: RRR Pulm: CTABL, nl effort ABD: s/nd/nt, fundus firm and below the umbilicus Lochia: moderate DVT Evaluation: LE non-ttp, no evidence of DVT on exam.  Hemoglobin  Date Value Ref Range Status  11/15/2018 11.0 (L) 12.0 - 15.0 g/dL Final   HGB  Date Value Ref Range Status  05/24/2013 9.6 (L) 12.0 - 16.0 g/dL Final   HCT   Date Value Ref Range Status  11/15/2018 32.3 (L) 36.0 - 46.0 % Final  05/22/2013 36.0 35.0 - 47.0 % Final    Post partum course: un complicated Postpartum Procedures: none Disposition: stable, discharge to home. Baby Feeding: formula Baby Disposition: home with mom  Rh Immune globulin given: n/a Rubella vaccine given: offered Flu vaccine given in AP or PP setting: AP 8.2020  Contraception: TBD  Prenatal Labs: Blood type/Rh --/--/O POS (09/28 1901)  Antibody screen neg  Rubella Non-immune  Varicella immune  RPR negative  HBsAg negative  HIV negative  GC negative  Chlamydia negative  Genetic screening declined  1 hour GTT 111  3 hour GTT n/a  GBS negative   History of CIN 3 with + margins, and abnormal pap in 04/2018.  Plan:  Lacey Clark was discharged to home in good condition. Follow-up appointment at [facility] with delivering provider in 6 weeks.  She will also need a colposcopic exam.   Discharge Medications: Allergies as of 11/16/2018   No Known Allergies     Medication List    TAKE these medications   ferrous sulfate 324 MG Tbec Take 324 mg by mouth.   multivitamin-prenatal 27-0.8 MG Tabs tablet Take 1 tablet by mouth daily at 12 noon.   ondansetron 4 MG disintegrating tablet Commonly known as: ZOFRAN-ODT Take 1 tablet (4 mg total) by mouth every 6 (six) hours as needed for nausea or vomiting.       Follow-up Information    11/18/2018, MD Follow up in 6 week(s).  Specialty: Obstetrics and Gynecology Contact information: [address] [place] [address] [phone]           Signed: ----- Larey Days, MD, [place] Attending Obstetrician and Gynecologist [facility], Department of [facility]

## 2018-11-16 LAB — HIV ANTIBODY (ROUTINE TESTING W REFLEX): HIV Screen 4th Generation wRfx: NONREACTIVE

## 2018-11-16 LAB — HEPATITIS B SURFACE ANTIGEN: Hepatitis B Surface Ag: NEGATIVE

## 2018-11-16 LAB — RPR: RPR Ser Ql: NONREACTIVE — AB

## 2018-11-16 NOTE — Discharge Instructions (Signed)
Parto vaginal, cuidados de puerperio Postpartum Care After Vaginal Delivery Lea esta informacin sobre cmo cuidarse desde el momento en que nazca su beb y hasta 6 a 12 semanas despus del parto (perodo del posparto). El mdico tambin podr darle instrucciones ms especficas. Comunquese con su mdico si tiene problemas o preguntas. Siga estas indicaciones en su casa: Hemorragia vaginal  Es normal tener un poco de hemorragia vaginal (loquios) despus del parto. Use un apsito sanitario para el sangrado vaginal y secrecin. ? Durante la primera semana despus del parto, la cantidad y el aspecto de los loquios a menudo es similar a las del perodo menstrual. ? Durante las siguientes semanas disminuir gradualmente hasta convertirse en una secrecin seca amarronada o amarillenta. ? En la mayora de las mujeres, los loquios se detienen completamente entre 4 a 6semanas despus del parto. Los sangrados vaginales pueden variar de mujer a mujer.  Cambie los apsitos sanitarios con frecuencia. Observe si hay cambios en el flujo, como: ? Un aumento repentino en el volumen. ? Cambio en el color. ? Cogulos sanguneos grandes.  Si expulsa un cogulo de sangre por la vagina, gurdelo y llame al mdico para informrselo. No deseche los cogulos de sangre por el inodoro antes de hablar con su mdico.  No use tampones ni se haga duchas vaginales hasta que el mdico la autorice.  Si no est amamantando, volver a tener su perodo entre 6 y 8 semanas despus del parto. Si solamente alimenta al beb con leche materna (lactancia materna exclusiva), podra no volver a tener su perodo hasta que deje de amamantar. Cuidados perineales  Mantenga la zona entre la vagina y el ano (perineo) limpia y seca, como se lo haya indicado el mdico. Utilice apsitos o aerosoles analgsicos y cremas, como se lo hayan indicado.  Si le hicieron un corte en el perineo (episiotoma) o tuvo un desgarro en la vagina, controle la  zona para detectar signos de infeccin hasta que sane. Est atenta a los siguientes signos: ? Aumento del enrojecimiento, la hinchazn o el dolor. ? Presenta lquido o sangre que supura del corte o desgarro. ? Calor. ? Pus o mal olor.  Es posible que le den una botella rociadora para que use en lugar de limpiarse el rea con papel higinico despus de usar el bao. Cuando comience a sanar, podr usar la botella rociadora antes de secarse. Asegrese de secarse suavemente.  Para aliviar el dolor causado por una episiotoma, un desgarro en la vagina o venas hinchadas en el ano (hemorroides), trate de tomar un bao de asiento tibio 2 o 3 veces por da. Un bao de asiento es un bao de agua tibia que se toma mientras se est sentado. El agua solo debe llegar hasta las caderas y cubrir las nalgas. Cuidado de las mamas  En los primeros das despus del parto, las mamas pueden sentirse pesadas, llenas e incmodas (congestin mamaria). Tambin puede escaparse leche de sus senos. El mdico puede sugerirle mtodos para aliviar este malestar. La congestin mamaria debera desaparecer al cabo de unos das.  Si est amamantando: ? Use un sostn que sujete y ajuste bien sus pechos. ? Mantenga los pezones secos y limpios. Aplquese cremas y ungentos, como se lo haya indicado el mdico. ? Es posible que deba usar discos de algodn en el sostn para absorber la leche que se filtre de sus senos. ? Puede tener contracciones uterinas cada vez que amamante durante varias semanas despus del parto. Las contracciones uterinas ayudan al tero a   regresar a su tamao habitual. ? Si tiene algn problema con la lactancia materna, colabore con el mdico o un asesor en lactancia.  Si no est amamantando: ? Evite tocarse mucho las mamas. Al hacerlo, podran producir ms leche. ? Use un sostn que le proporcione el ajuste correcto y compresas fras para reducir la hinchazn. ? No extraiga (saque) leche materna. Esto har que  produzca ms leche. Intimidad y sexualidad  Pregntele al mdico cundo puede retomar la actividad sexual. Esto puede depender de lo siguiente: ? Su riesgo de sufrir infecciones. ? La rapidez con la que est sanando. ? Su comodidad y deseo de retomar la actividad sexual.  Despus del parto, puede quedar embarazada incluso si no ha tenido todava su perodo. Si lo desea, hable con el mdico acerca de los mtodos de control de la natalidad (mtodos anticonceptivos). Medicamentos  Tome los medicamentos de venta libre y los recetados solamente como se lo haya indicado el mdico.  Si le recetaron un antibitico, tmelo como se lo haya indicado el mdico. No deje de tomar el antibitico aunque comience a sentirse mejor. Actividad  Retome sus actividades normales de a poco como se lo haya indicado el mdico. Pregntele al mdico qu actividades son seguras para usted.  Descanse todo lo que pueda. Trate de descansar o tomar una siesta mientras el beb duerme. Comida y bebida   Beba suficiente lquido como para mantener la orina de color amarillo plido.  Coma alimentos ricos en fibras todos los das. Estos pueden ayudarla a prevenir o aliviar el estreimiento. Los alimentos ricos en fibras incluyen, entre otros: ? Panes y cereales integrales. ? Arroz integral. ? Frijoles. ? Frutas y verduras frescas.  No intente perder de peso rpidamente reduciendo el consumo de caloras.  Tome sus vitaminas prenatales hasta la visita de seguimiento de posparto o hasta que su mdico le indique que puede dejar de tomarlas. Estilo de vida  No consuma ningn producto que contenga nicotina o tabaco, como cigarrillos y cigarrillos electrnicos. Si necesita ayuda para dejar de fumar, consulte al mdico.  No beba alcohol, especialmente si est amamantando. Instrucciones generales  Concurra a todas las visitas de seguimiento para usted y el beb, como se lo haya indicado el mdico. La mayora de las mujeres  visita al mdico para un seguimiento de posparto dentro de las primeras 3 a 6 semanas despus del parto. Comunquese con un mdico si:  Se siente incapaz de controlar los cambios que implica tener un hijo y esos sentimientos no desaparecen.  Siente tristeza o preocupacin de forma inusual.  Las mamas se ponen rojas, le duelen o se endurecen.  Tiene fiebre.  Tiene dificultad para retener la orina o para impedir que la orina se escape.  Tiene poco inters o falta de inters en actividades que solan gustarle.  No ha amamantado nada y no ha tenido un perodo menstrual durante 12 semanas despus del parto.  Dej de amamantar al beb y no ha tenido su perodo menstrual durante 12 semanas despus de dejar de amamantar.  Tiene preguntas sobre su cuidado y el del beb.  Elimina un cogulo de sangre grande por la vagina. Solicite ayuda de inmediato si:  Siente dolor en el pecho.  Tiene dificultad para respirar.  Tiene un dolor repentino e intenso en la pierna.  Tiene dolor intenso o clicos en el la parte inferior del abdomen.  Tiene una hemorragia tan intensa de la vagina que empapa ms de un apsito en una hora. El sangrado   no debe ser ms abundante que el perodo ms intenso que haya tenido.  Dolor de cabeza intenso.  Se desmaya.  Tiene visin borrosa o manchas en la vista.  Tiene secrecin vaginal con mal olor.  Tiene pensamientos acerca de lastimarse a usted misma o a su beb. Si alguna vez siente que puede lastimarse a usted misma o a otras personas, o tiene pensamientos de poner fin a su vida, busque ayuda de inmediato. Puede dirigirse al departamento de emergencias ms cercano o llamar a:  El servicio de emergencias de su localidad (911 en [place]  Una lnea de asistencia al suicida y atencin en crisis, como la Lnea Nacional de Prevencin del Suicidio (National Suicide Prevention Lifeline), al 1-800-273-8255. Est disponible las 24 horas del da. Resumen  El  perodo de tiempo justo despus el parto y hasta 6 a 12 semanas despus del parto se denomina perodo posparto.  Retome sus actividades normales de a poco como se lo haya indicado el mdico.  Concurra a todas las visitas de seguimiento para usted y el beb, como se lo haya indicado el mdico. Esta informacin no tiene como fin reemplazar el consejo del mdico. Asegrese de hacerle al mdico cualquier pregunta que tenga. Document Released: 11/30/2006 Document Revised: 05/15/2017 Document Reviewed: 01/24/2017 Elsevier Patient Education  2020 Elsevier Inc.  

## 2018-11-16 NOTE — Progress Notes (Signed)
Pt discharged with infant.  Discharge instructions, prescriptions and follow up appointment given to and reviewed with pt. Pt verbalized understanding. Escorted out by staff. 

## 2019-05-12 ENCOUNTER — Ambulatory Visit: Payer: 59 | Attending: Internal Medicine

## 2019-05-12 DIAGNOSIS — Z23 Encounter for immunization: Secondary | ICD-10-CM

## 2019-05-12 NOTE — Progress Notes (Signed)
   Covid-19 Vaccination Clinic  Name:  Arnie Maiolo    MRN: [id number] DOB: 15-Mar-1990  05/12/2019  Ms. Leola Fiore was observed post Covid-19 immunization for 15 minutes without incident. She was provided with Vaccine Information Sheet and instruction to access the V-Safe system.   Ms. Ileana Chalupa was instructed to call 911 with any severe reactions post vaccine: Marland Kitchen Difficulty breathing  . Swelling of face and throat  . A fast heartbeat  . A bad rash all over body  . Dizziness and weakness   Immunizations Administered    Name Date Dose VIS Date Route   Pfizer COVID-19 Vaccine 05/12/2019  3:44 AM 0.3 mL 01/27/2019 Intramuscular   Manufacturer: [employer], [employer]   Lot: [id number]   NDC: [address]-1

## 2019-06-02 ENCOUNTER — Ambulatory Visit: Payer: 59 | Attending: Internal Medicine

## 2019-06-02 DIAGNOSIS — Z23 Encounter for immunization: Secondary | ICD-10-CM

## 2019-06-02 NOTE — Progress Notes (Signed)
   Covid-19 Vaccination Clinic  Name:  Lacey Clark    MRN: [id number] DOB: [birth year]  06/02/2019  Ms. Lacey Clark was observed post Covid-19 immunization for 15 minutes without incident. She was provided with Vaccine Information Sheet and instruction to access the V-Safe system.   Ms. Lacey Clark was instructed to call 911 with any severe reactions post vaccine: Marland Kitchen Difficulty breathing  . Swelling of face and throat  . A fast heartbeat  . A bad rash all over body  . Dizziness and weakness   Immunizations Administered    Name Date Dose VIS Date Route   Pfizer COVID-19 Vaccine 06/02/2019  3:44 PM 0.3 mL 01/27/2019 Intramuscular   Manufacturer: [employer], [employer]   Lot: [id number]   NDC: [address]-9

## 2020-05-19 IMAGING — US OBSTETRIC 14+ WK ULTRASOUND
1 series · 13 of 28 positions shown · non-contrast
Comparison: none

CLINICAL DATA: Current assigned gestational age of 19 weeks 4 days.
Fetal anatomic survey.

EXAM:
OBSTETRICAL ULTRASOUND >14 WKS

[Series 1: obstetric 14+ wk ultrasound · 0.26mm/px · 13 of 91 slices shown]
[im 4/91]
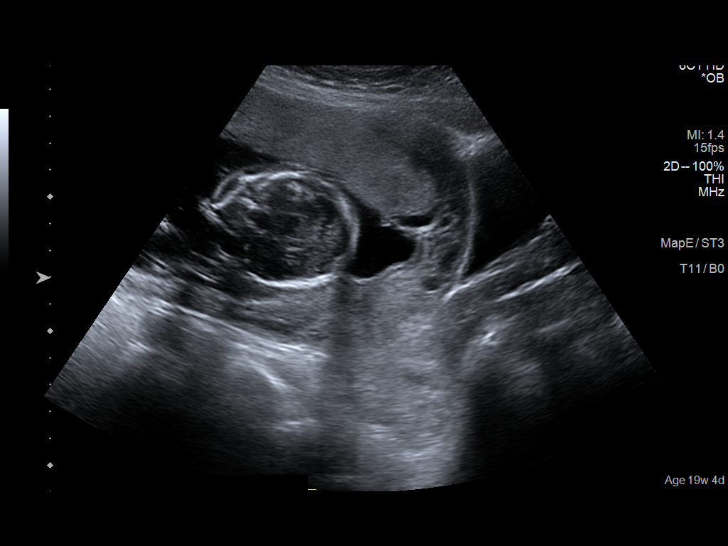
[im 11/91]
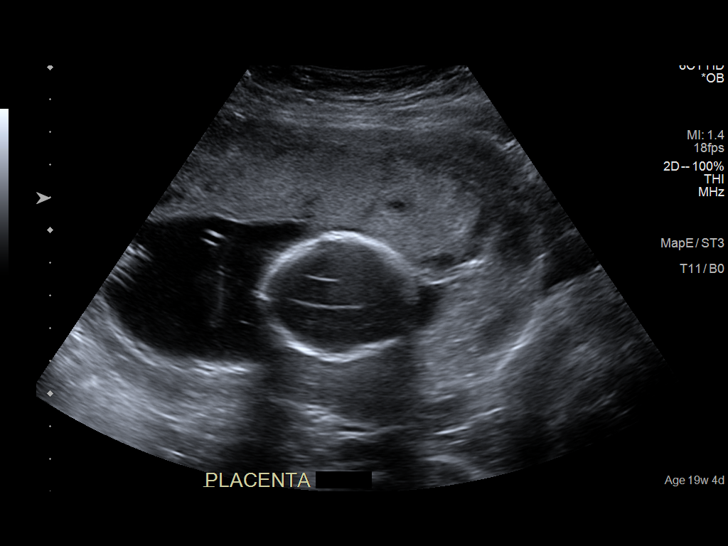
[im 17/91]
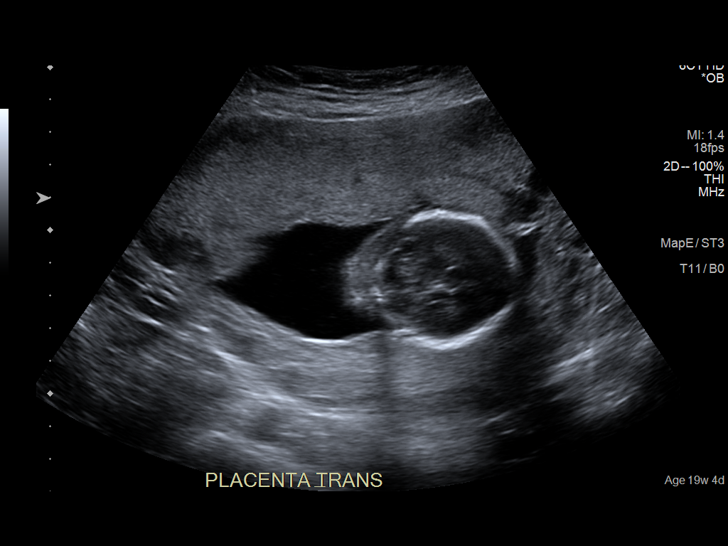
[im 24/91]
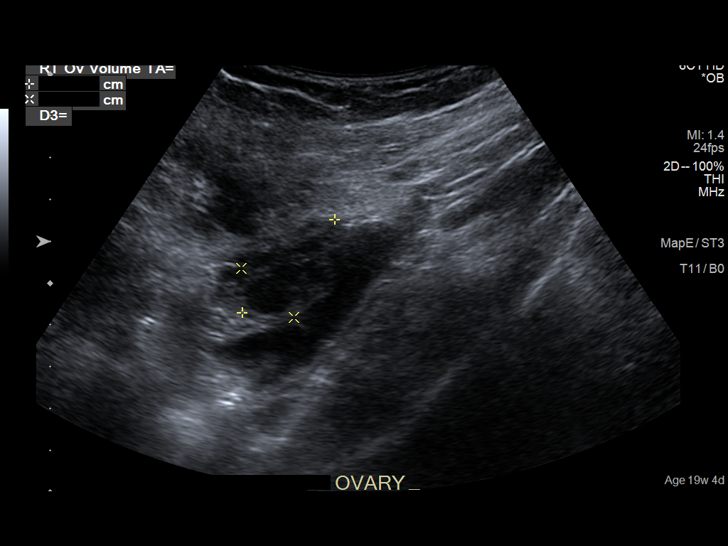
[im 31/91]
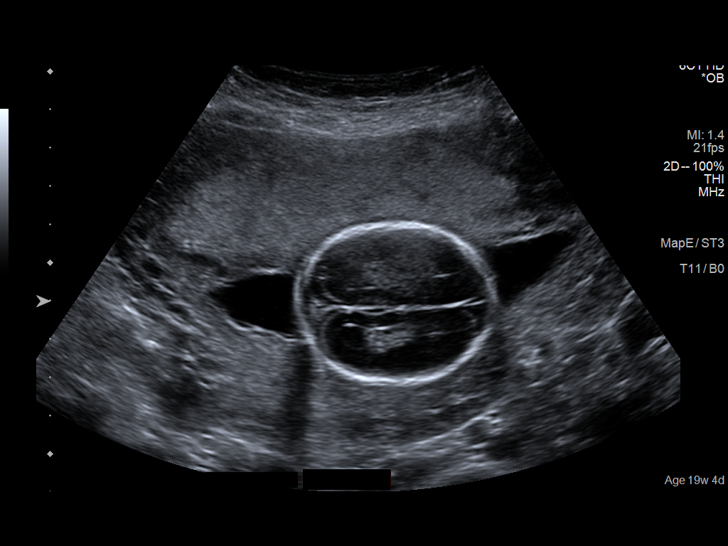
[im 37/91]
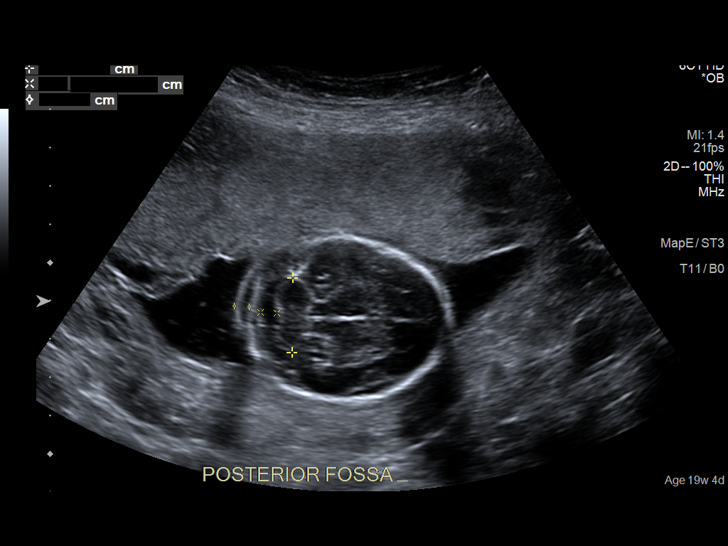
[im 47/91]
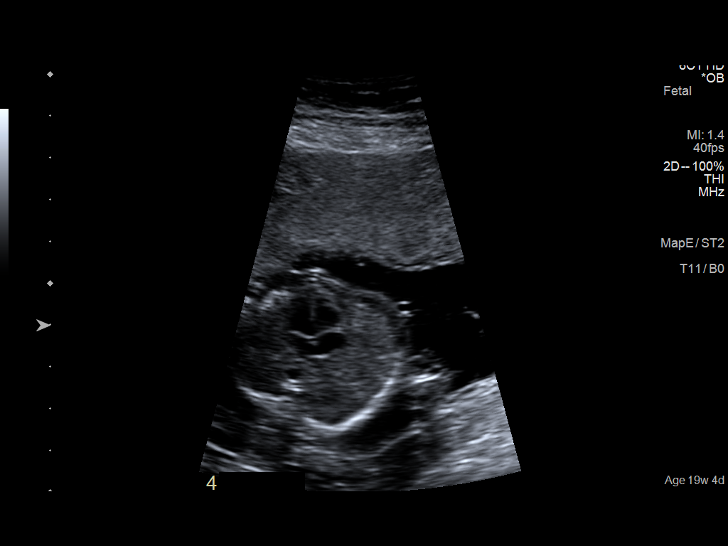
[im 54/91]
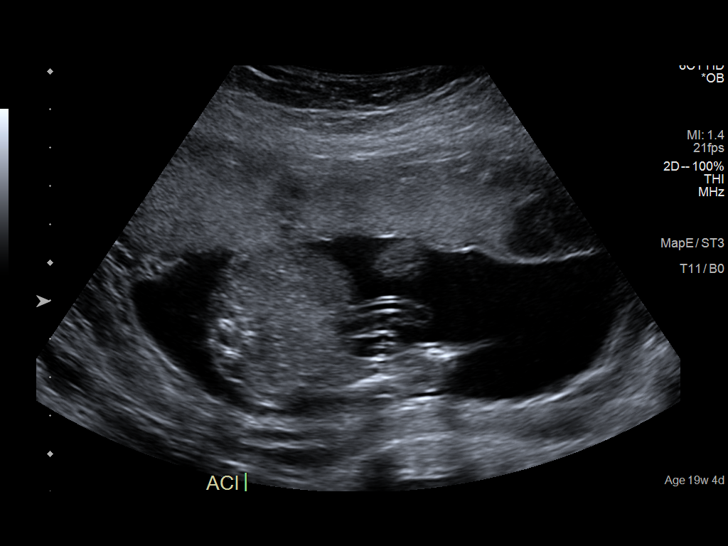
[im 61/91]
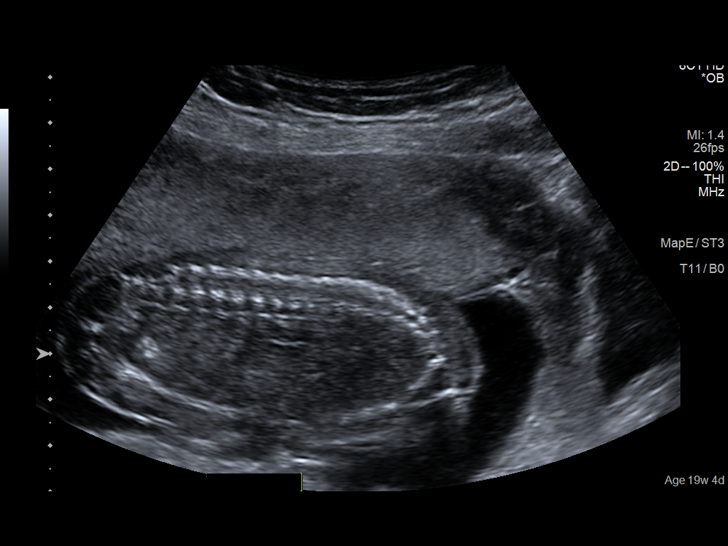
[im 67/91]
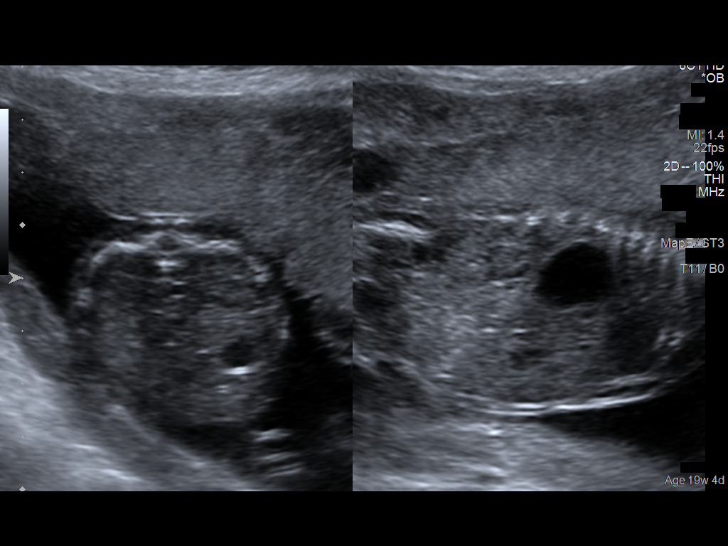
[im 74/91]
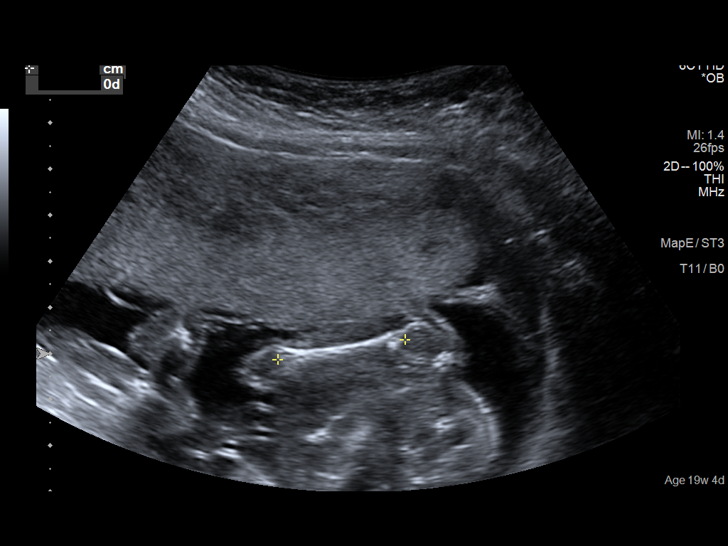
[im 81/91]
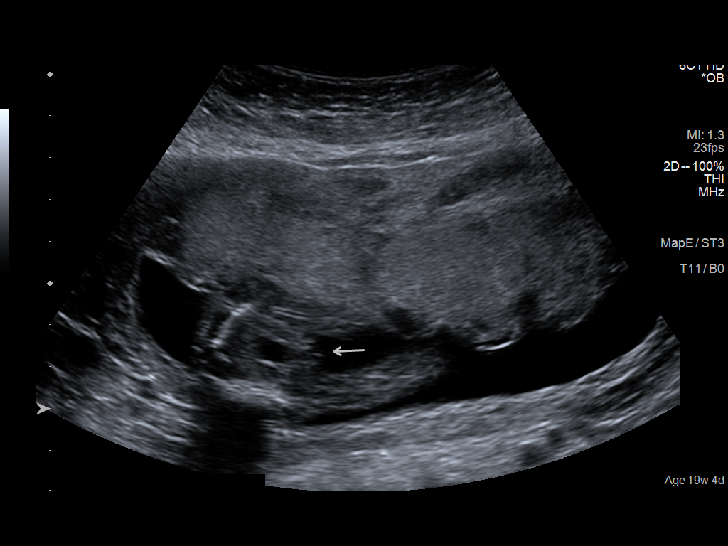
[im 87/91]
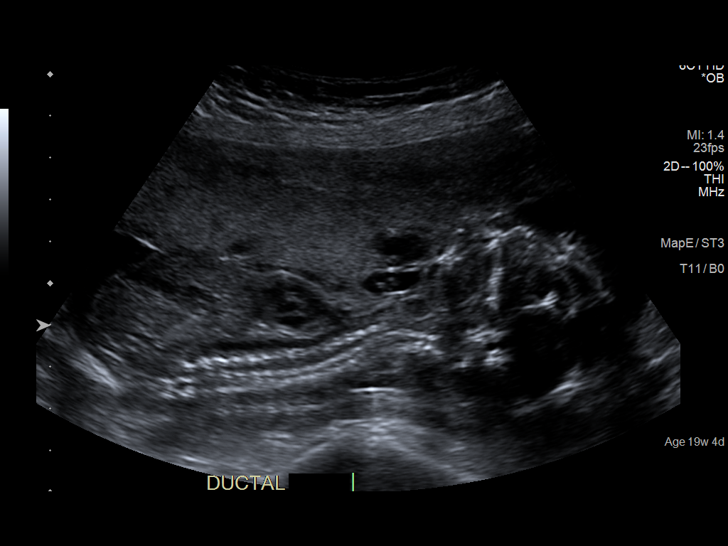

[13 of 28 positions shown; findings below may reference images not displayed]

FINDINGS: Number of Fetuses: 1

Heart Rate:  149 bpm

Movement: Yes

Presentation: Cephalic

Previa: No

Placental Location: Anterior

Amniotic Fluid (Subjective): Within normal limits

Amniotic Fluid (Objective):

Vertical pocket = 4.0cm

FETAL BIOMETRY

BPD: 4.3cm 18w 6d

HC:   16.2cm 19w 0d

AC:   14.3cm 19w 5d

FL:   3.0cm 19w 1d

Current Mean GA: 19w 1d US EDC: 11/30/2018

Assigned GA:  19w 4d Assigned EDC: 11/27/2018

FETAL ANATOMY

Lateral Ventricles: Appears normal

Thalami/CSP: Appears normal

Posterior Fossa:  Appears normal

Nuchal Region: Appears normal   NFT= 3.9 mm

Upper Lip: Appears normal

Spine: Appears normal

4 Chamber Heart on Left: Appears normal

LVOT: Appears normal

RVOT: Appears normal

Stomach on Left: Appears normal

3 Vessel Cord: Appears normal

Cord Insertion site: Appears normal

Kidneys: Appears normal

Bladder: Appears normal

Extremities: Appears normal

Technically difficult due to: Fetal position

Maternal Findings:

Cervix:  5.9 cm TA
IMPRESSION: Current assigned gestational age of nineteen weeks 4 days.
Appropriate fetal growth.

Normal visualized fetal anatomy.  No anomalies identified.

## 2020-05-20 IMAGING — US ULTRASOUND ABDOMEN LIMITED
1 series · 14 of 25 positions shown · non-contrast
Comparison: None.

CLINICAL DATA: Epigastric pain

EXAM:
ULTRASOUND ABDOMEN LIMITED RIGHT UPPER QUADRANT

[Series 1: ultrasound abdomen limited · 14 of 51 slices shown]
[im 1/51]
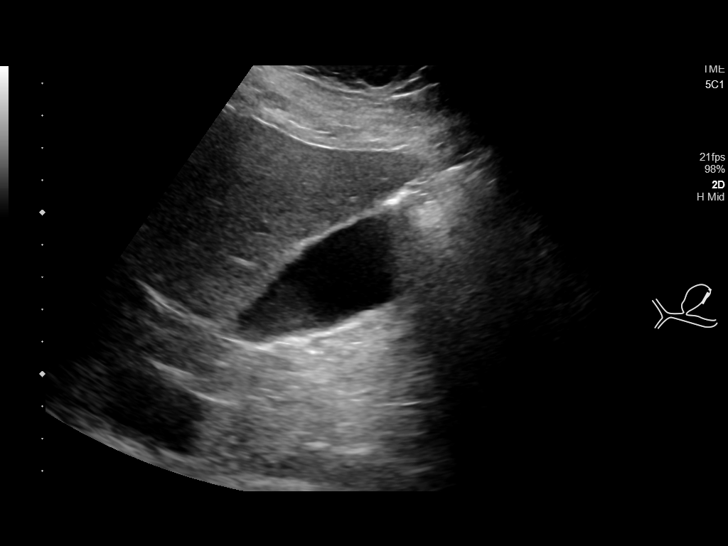
[im 5/51]
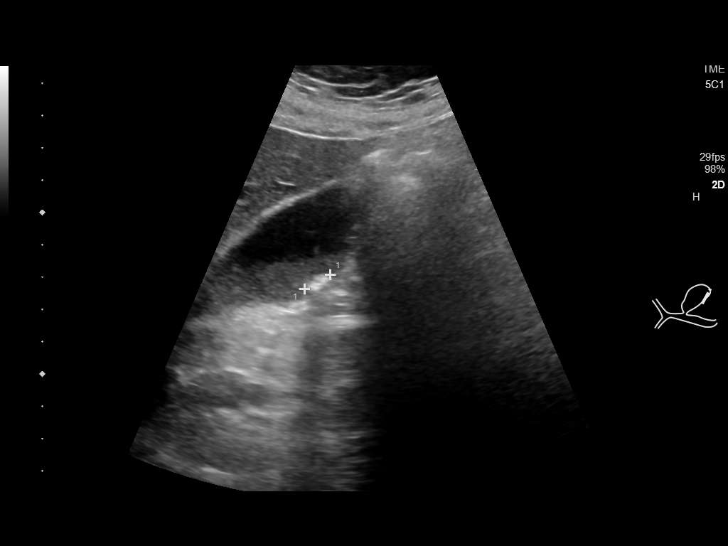
[im 9/51]
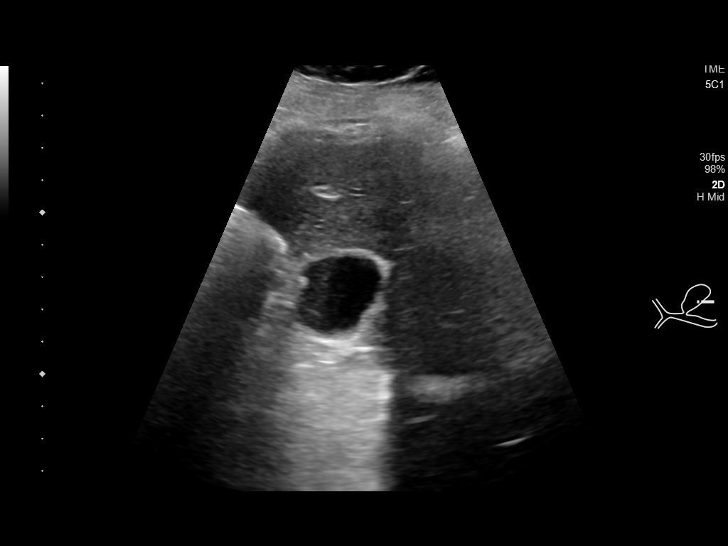
[im 13/51]
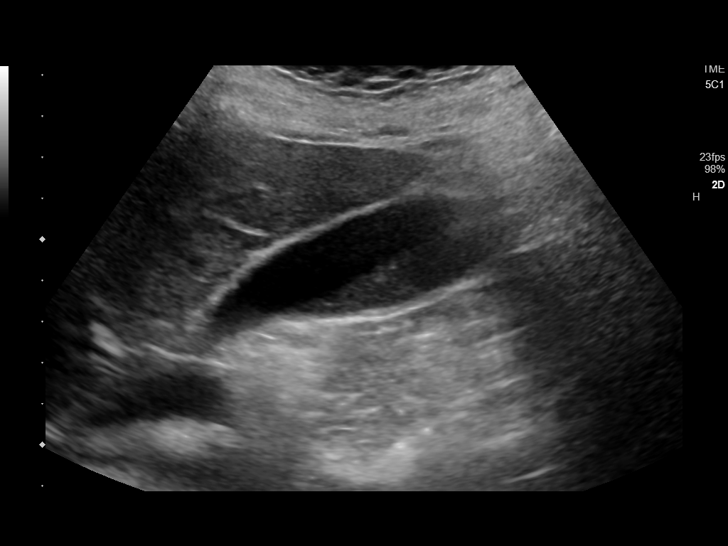
[im 17/51]
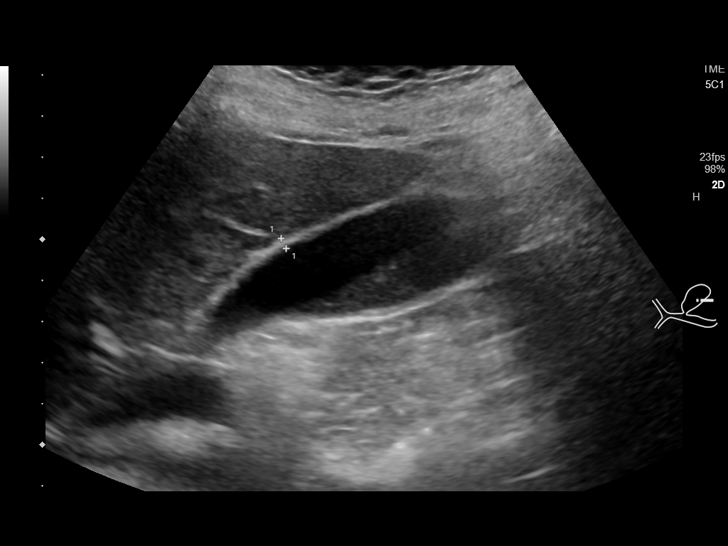
[im 19/51]
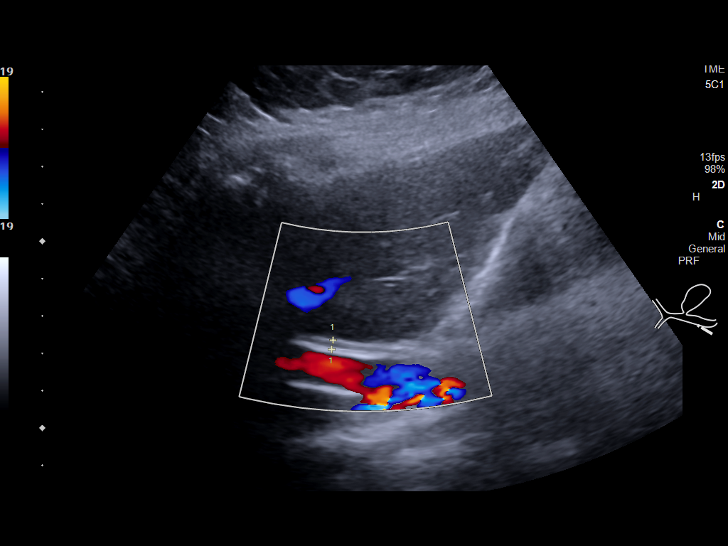
[im 23/51]
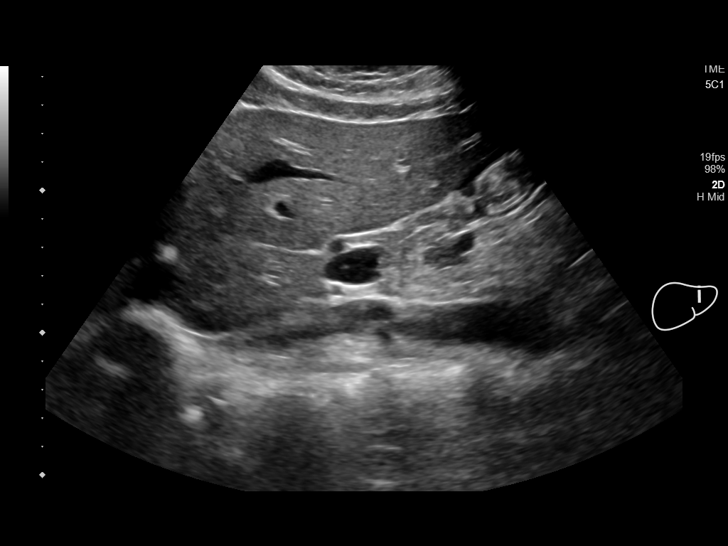
[im 28/51]
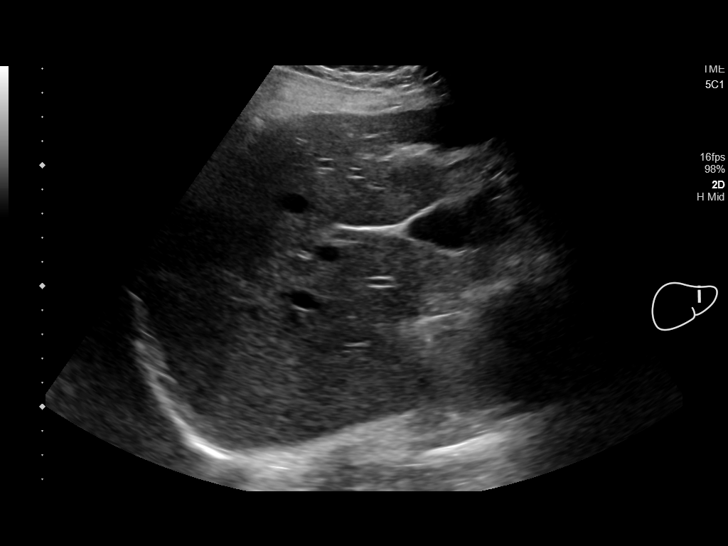
[im 32/51]
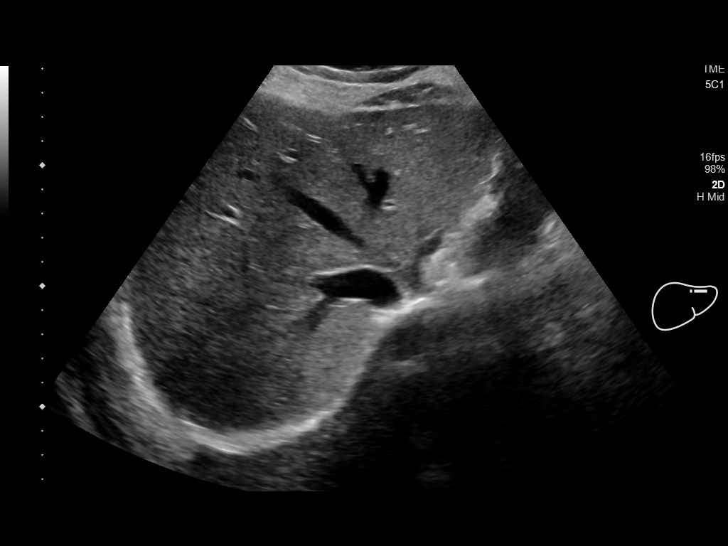
[im 34/51]
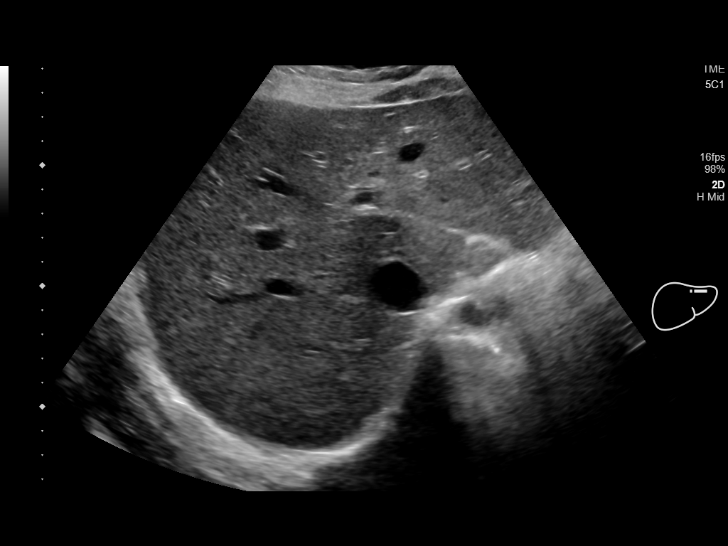
[im 38/51]
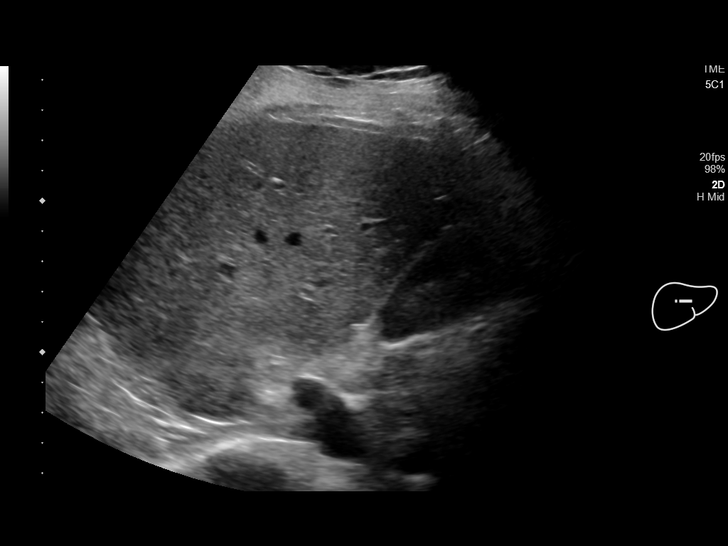
[im 42/51]
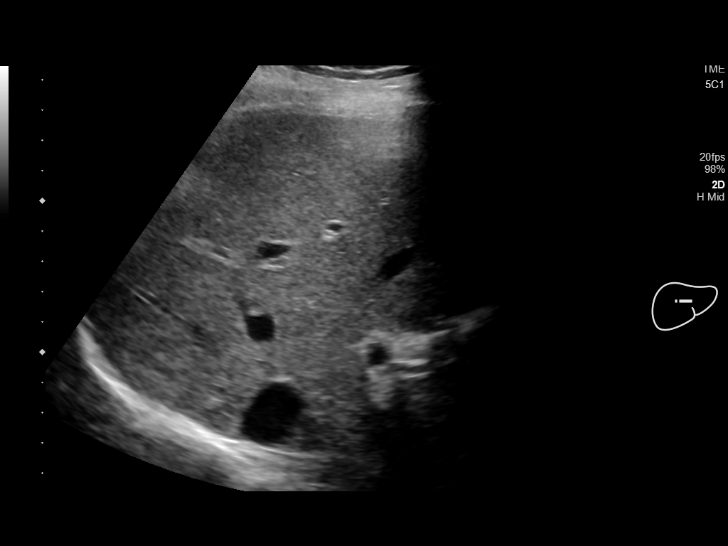
[im 46/51]
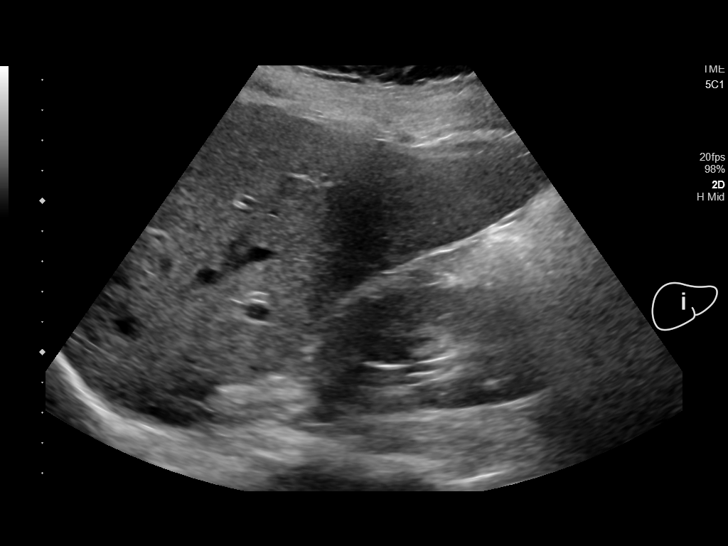
[im 51/51]
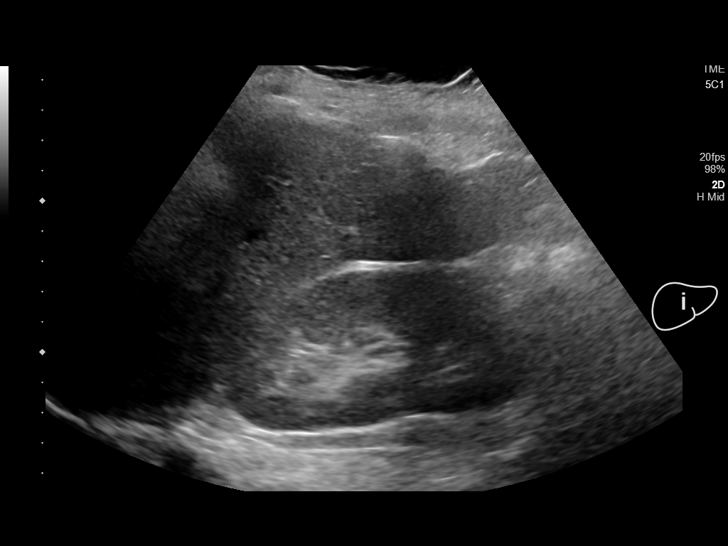

[14 of 25 positions shown; findings below may reference images not displayed]

FINDINGS: Gallbladder:

Within the gallbladder, there are echogenic foci which move and
shadow consistent with cholelithiasis. Largest gallstone measures 9
mm in length. There is moderate sludge in the gallbladder as well.
There is no appreciable gallbladder wall thickening or
pericholecystic fluid. No sonographic Murphy sign noted by
sonographer.

Common bile duct:

Diameter: 3 mm. No intrahepatic or extrahepatic biliary duct
dilatation.

Liver:

No focal lesion identified. Within normal limits in parenchymal
echogenicity. Portal vein is patent on color Doppler imaging with
normal direction of blood flow towards the liver.
IMPRESSION: Cholelithiasis and sludge in gallbladder. No gallbladder wall
thickening or pericholecystic fluid. Study otherwise unremarkable.

## 2020-09-05 ENCOUNTER — Emergency Department: Payer: 59

## 2020-09-05 ENCOUNTER — Other Ambulatory Visit: Payer: Self-pay

## 2020-09-05 ENCOUNTER — Emergency Department
Admission: EM | Admit: 2020-09-05 | Discharge: 2020-09-05 | Disposition: A | Discharge status: Home / Self Care | Payer: 59 | Attending: Emergency Medicine | Admitting: Emergency Medicine

## 2020-09-05 DIAGNOSIS — K219 Gastro-esophageal reflux disease without esophagitis: Secondary | ICD-10-CM | POA: Insufficient documentation

## 2020-09-05 DIAGNOSIS — R1010 Upper abdominal pain, unspecified: Secondary | ICD-10-CM | POA: Diagnosis present

## 2020-09-05 DIAGNOSIS — K8020 Calculus of gallbladder without cholecystitis without obstruction: Secondary | ICD-10-CM | POA: Insufficient documentation

## 2020-09-05 LAB — URINALYSIS, COMPLETE (UACMP) WITH MICROSCOPIC
Bilirubin Urine: NEGATIVE
Glucose, UA: NEGATIVE mg/dL
Ketones, ur: NEGATIVE mg/dL
Leukocytes,Ua: NEGATIVE
Nitrite: NEGATIVE
Protein, ur: NEGATIVE mg/dL
Specific Gravity, Urine: 1.013 (ref 1.005–1.030)
pH: 6 (ref 5.0–8.0)

## 2020-09-05 LAB — TROPONIN I (HIGH SENSITIVITY): Troponin I (High Sensitivity): 3 ng/L (ref <18)

## 2020-09-05 LAB — COMPREHENSIVE METABOLIC PANEL
ALT: 15 U/L (ref 0–44)
AST: 16 U/L (ref 15–41)
Albumin: 4.2 g/dL (ref 3.5–5.0)
Alkaline Phosphatase: 91 U/L (ref 38–126)
Anion gap: 9 (ref 5–15)
BUN: 14 mg/dL (ref 6–20)
CO2: 22 mmol/L (ref 22–32)
Calcium: 9.5 mg/dL (ref 8.9–10.3)
Chloride: 105 mmol/L (ref 98–111)
Creatinine, Ser: 0.53 mg/dL (ref 0.44–1.00)
GFR, Estimated: >60 mL/min (ref >60)
Glucose, Bld: 142 mg/dL — ABNORMAL HIGH (ref 70–99)
Potassium: 3.6 mmol/L (ref 3.5–5.1)
Sodium: 136 mmol/L (ref 135–145)
Total Bilirubin: 0.3 mg/dL (ref 0.3–1.2)
Total Protein: 8 g/dL (ref 6.5–8.1)

## 2020-09-05 LAB — CBC
HCT: 38.5 % (ref 36.0–46.0)
Hemoglobin: 12.9 g/dL (ref 12.0–15.0)
MCH: 29.8 pg (ref 26.0–34.0)
MCHC: 33.5 g/dL (ref 30.0–36.0)
MCV: 88.9 fL (ref 80.0–100.0)
Platelets: 325 10*3/uL (ref 150–400)
RBC: 4.33 MIL/uL (ref 3.87–5.11)
RDW: 14.1 % (ref 11.5–15.5)
WBC: 16 10*3/uL — ABNORMAL HIGH (ref 4.0–10.5)
nRBC: 0 % (ref 0.0–0.2)

## 2020-09-05 LAB — POC URINE PREG, ED: Preg Test, Ur: NEGATIVE

## 2020-09-05 LAB — LIPASE, BLOOD: Lipase: 46 U/L (ref 11–51)

## 2020-09-05 MED ORDER — LIDOCAINE VISCOUS HCL 2 % MT SOLN
15.0000 mL | Freq: Once | OROMUCOSAL | Status: AC
  Administered 2020-09-05: 15 mL via ORAL
  Filled 2020-09-05: qty 15

## 2020-09-05 MED ORDER — ONDANSETRON 4 MG PO TBDP
4.0000 mg | ORAL_TABLET | Freq: Once | ORAL | Status: AC
  Administered 2020-09-05: 4 mg via ORAL
  Filled 2020-09-05: qty 1

## 2020-09-05 MED ORDER — ONDANSETRON 4 MG PO TBDP: 4.0000 mg | ORAL_TABLET | Freq: Four times a day (QID) | ORAL | 0 refills | Status: AC | PRN

## 2020-09-05 MED ORDER — DICYCLOMINE HCL 20 MG PO TABS: 20.0000 mg | ORAL_TABLET | Freq: Three times a day (TID) | ORAL | 0 refills | Status: AC | PRN

## 2020-09-05 MED ORDER — PANTOPRAZOLE SODIUM 40 MG PO TBEC: 40.0000 mg | DELAYED_RELEASE_TABLET | Freq: Every day | ORAL | 1 refills | Status: AC

## 2020-09-05 MED ORDER — ALUM & MAG HYDROXIDE-SIMETH 200-200-20 MG/5ML PO SUSP
30.0000 mL | Freq: Once | ORAL | Status: AC
  Administered 2020-09-05: 30 mL via ORAL
  Filled 2020-09-05: qty 30

## 2020-09-05 MED ORDER — PANTOPRAZOLE SODIUM 40 MG PO TBEC
40.0000 mg | DELAYED_RELEASE_TABLET | Freq: Once | ORAL | Status: AC
  Administered 2020-09-05: 40 mg via ORAL
  Filled 2020-09-05: qty 1

## 2020-09-05 NOTE — ED Notes (Signed)
Pt given a cup of water to drink. 

## 2020-09-05 NOTE — ED Provider Notes (Signed)
[facility] Emergency Department Provider Note  ____________________________________________   Event Date/Time   First MD Initiated Contact with Patient 09/05/20 0234     (approximate)  I have reviewed the triage vital signs and the nursing notes.   HISTORY  Chief Complaint Abdominal Pain    HPI Lacey Clark is a 30 y.o. female past medical history who presents to the emergency department with complaints of upper abdominal burning pain that started today.  States she ate enchiladas for lunch around 4 PM and then pain started afterwards.  She tried an antacid at home without relief.  She states she has having some lower chest pressure but no shortness of breath.  Had nausea but no vomiting, diarrhea, bloody stools or melena.  No pain in the lower abdomen.  No history of abdominal surgeries.  No history of PE, DVT, exogenous estrogen use, recent fractures, surgery, trauma, hospitalization, prolonged travel or other immobilization. No lower extremity swelling or pain. No calf tenderness.  No dysuria, hematuria, vaginal bleeding or discharge.  Has had similar symptoms once before with pregnancy that resolved spontaneously.        Past Medical History:  Diagnosis Date   Medical history non-contributory     Patient Active Problem List   Diagnosis Date Noted   Indication for care in labor and delivery, antepartum 11/14/2018   Pregnancy 10/18/2018    Past Surgical History:  Procedure Laterality Date   NO PAST SURGERIES      Prior to Admission medications   Medication Sig Start Date End Date Taking? Authorizing Provider  dicyclomine (BENTYL) 20 MG tablet Take 1 tablet (20 mg total) by mouth every 8 (eight) hours as needed for spasms (Abdominal cramping). 09/05/20  Yes [place], Baxter Hire N, DO  ondansetron (ZOFRAN ODT) 4 MG disintegrating tablet Take 1 tablet (4 mg total) by mouth every 6 (six) hours as needed for nausea or vomiting. 09/05/20  Yes Zendayah Hardgrave,  Baxter Hire N, DO  pantoprazole (PROTONIX) 40 MG tablet Take 1 tablet (40 mg total) by mouth daily. 09/05/20 11/04/20 Yes Satrina Magallanes N, DO  ferrous sulfate 324 MG TBEC Take 324 mg by mouth.    [provider]  Prenatal Vit-Fe Fumarate-FA (MULTIVITAMIN-PRENATAL) 27-0.8 MG TABS tablet Take 1 tablet by mouth daily at 12 noon.    [provider]    Allergies Patient has no known allergies.  History reviewed. No pertinent family history.  Social History Social History   Tobacco Use   Smoking status: Never   Smokeless tobacco: Never  Vaping Use   Vaping Use: Never used  Substance Use Topics   Alcohol use: Never   Drug use: Never    Review of Systems Constitutional: No fever. Eyes: No visual changes. ENT: No sore throat. Cardiovascular: + chest pain. Respiratory: Denies shortness of breath. Gastrointestinal: No  vomiting, diarrhea. Genitourinary: Negative for dysuria. Musculoskeletal: Negative for back pain. Skin: Negative for rash. Neurological: Negative for focal weakness or numbness.  ____________________________________________   PHYSICAL EXAM:  VITAL SIGNS: ED Triage Vitals [09/05/20 0124]  Enc Vitals Group     BP 136/86     Pulse Rate 80     Resp 17     Temp 98.6 F (37 C)     Temp Source Oral     SpO2 99 %     Weight 174 lb (78.9 kg)     Height 5\' 5"  (1.651 m)     Head Circumference      Peak  Flow      Pain Score 8     Pain Loc      Pain Edu?      Excl. in GC?    CONSTITUTIONAL: Alert and oriented and responds appropriately to questions. Well-appearing; well-nourished HEAD: Normocephalic EYES: Conjunctivae clear, pupils appear equal, EOM appear intact ENT: normal nose; moist mucous membranes NECK: Supple, normal ROM CARD: RRR; S1 and S2 appreciated; no murmurs, no clicks, no rubs, no gallops RESP: Normal chest excursion without splinting or tachypnea; breath sounds clear and equal bilaterally; no wheezes, no rhonchi, no rales, no  hypoxia or respiratory distress, speaking full sentences ABD/GI: Normal bowel sounds; non-distended; soft, tender to palpation in the right upper quadrant and epigastric region, no tenderness at McBurney's point BACK: The back appears normal EXT: Normal ROM in all joints; no deformity noted, no edema; no cyanosis SKIN: Normal color for age and race; warm; no rash on exposed skin NEURO: Moves all extremities equally PSYCH: The patient's mood and manner are appropriate.  ____________________________________________   LABS (all labs ordered are listed, but only abnormal results are displayed)  Labs Reviewed  COMPREHENSIVE METABOLIC PANEL - Abnormal; Notable for the following components:      Result Value   Glucose, Bld 142 (*)    All other components within normal limits  CBC - Abnormal; Notable for the following components:   WBC 16.0 (*)    All other components within normal limits  URINALYSIS, COMPLETE (UACMP) WITH MICROSCOPIC - Abnormal; Notable for the following components:   Color, Urine STRAW (*)    APPearance CLEAR (*)    Hgb urine dipstick MODERATE (*)    Bacteria, UA RARE (*)    All other components within normal limits  POC URINE PREG, ED - Normal  LIPASE, BLOOD  TROPONIN I (HIGH SENSITIVITY)   ____________________________________________  EKG   EKG Interpretation  Date/Time:  Thursday September 05 2020 01:35:35 EDT Ventricular Rate:  80 PR Interval:  150 QRS Duration: 78 QT Interval:  364 QTC Calculation: 419 R Axis:   22 Text Interpretation: Normal sinus rhythm Normal ECG Confirmed by Rochele Raring [phone]) on 09/05/2020 2:38:13 AM        ____________________________________________  RADIOLOGY Normajean Baxter Marquis Down, personally viewed and evaluated these images (plain radiographs) as part of my medical decision making, as well as reviewing the written report by the radiologist.  ED MD interpretation:  Gallstones without cholecystis.  Official radiology  report(s): US ABDOMEN LIMITED RUQ (LIVER/GB)  Result Date: 09/05/2020 CLINICAL DATA:  Epigastric pain EXAM: ULTRASOUND ABDOMEN LIMITED RIGHT UPPER QUADRANT COMPARISON:  None. FINDINGS: Gallbladder: There is cholelithiasis. A negative sonographic Eulah Pont sign was reported by the sonographer. No pericholecystic fluid or gallbladder wall thickening. Common bile duct: Diameter: 3 mm Liver: No focal lesion identified. Within normal limits in parenchymal echogenicity. Portal vein is patent on color Doppler imaging with normal direction of blood flow towards the liver. Other: None. IMPRESSION: Cholelithiasis without other evidence of acute cholecystitis. Electronically Signed   By: Deatra Robinson M.D.   On: 09/05/2020 03:18    ____________________________________________   PROCEDURES  Procedure(s) performed (including Critical Care):  Procedures    ____________________________________________   INITIAL IMPRESSION / ASSESSMENT AND PLAN / ED COURSE  As part of my medical decision making, I reviewed the following data within the electronic MEDICAL RECORD NUMBER Notes from prior ED visits and [place] Controlled Substance Database, EKG, labs and imaging, nursing notes         Patient here  with upper abdominal pain.  Differential includes gastritis, GERD, peptic ulcer disease, H. pylori, cholelithiasis, cholecystitis, cholangitis, pancreatitis.  Doubt appendicitis.  Labs from triage are unremarkable.  Normal WBC count, LFTs, lipase.  EKG nonischemic.  Troponin negative.  Doubt ACS, PE, dissection.  Right upper quadrant ultrasound pending.  Will give GI cocktail, Zofran, Protonix for symptom relief and reassess.  ED PROGRESS  Patient's US shows gallstones without obstruction or infection.  Patient reports feeling better and now tolerating po.  I suspect symtoms due to GERD and biliary colic.  Will dc with protonix, bentyl, zofran.  Will give outpatient surgery follow up information.  Discussed diet  recommendations and return precautions.  Patient comfortable with plan.  At this time, I do not feel there is any life-threatening condition present. I have reviewed, interpreted and discussed all results (EKG, imaging, lab, urine as appropriate) and exam findings with patient/family. I have reviewed nursing notes and appropriate previous records.  I feel the patient is safe to be discharged home without further emergent workup and can continue workup as an outpatient as needed. Discussed usual and customary return precautions. Patient/family verbalize understanding and are comfortable with this plan.  Outpatient follow-up has been provided as needed. All questions have been answered.  ____________________________________________   FINAL CLINICAL IMPRESSION(S) / ED DIAGNOSES  Final diagnoses:  Cholelithiasis without cholecystitis  Gastroesophageal reflux disease, unspecified whether esophagitis present     ED Discharge Orders          Ordered    pantoprazole (PROTONIX) 40 MG tablet  Daily        09/05/20 0408    ondansetron (ZOFRAN ODT) 4 MG disintegrating tablet  Every 6 hours PRN        09/05/20 0408    dicyclomine (BENTYL) 20 MG tablet  Every 8 hours PRN        09/05/20 0408            *Please note:  Lindi Kaveri Perras was evaluated in Emergency Department on 09/05/2020 for the symptoms described in the history of present illness. She was evaluated in the context of the global COVID-19 pandemic, which necessitated consideration that the patient might be at risk for infection with the SARS-CoV-2 virus that causes COVID-19. Institutional protocols and algorithms that pertain to the evaluation of patients at risk for COVID-19 are in a state of rapid change based on information released by regulatory bodies including the CDC and federal and state organizations. These policies and algorithms were followed during the patient's care in the ED.  Some ED evaluations and interventions may be  delayed as a result of limited staffing during and the pandemic.*   Note:  This document was prepared using Dragon voice recognition software and may include unintentional dictation errors.    Peni Rupard, Layla Maw, DO 09/05/20 [phone]

## 2020-09-05 NOTE — ED Notes (Signed)
Ultrasound at bedside, patient said that pain is diminished some since she has been here, alert and orientedx4.

## 2020-09-05 NOTE — ED Notes (Signed)
Pt alert and oriented x 4, verbalized discharge instructions, no questions at this time 

## 2020-09-05 NOTE — Discharge Instructions (Addendum)
Please avoid NSAIDs such as aspirin (Goody powders), ibuprofen (Motrin, Advil), naproxen (Aleve) as these may worsen your symptoms.  Tylenol 1000 mg every 6 hours is safe to take as long as you have no history of liver problems (heavy alcohol use, cirrhosis, hepatitis).  Please avoid spicy, acidic (citrus fruits, tomato based sauces, salsa), greasy, fatty foods.  Please avoid caffeine and alcohol.  Smoking can also make GERD/acid reflux worse.  Over the counter medications such as TUMS, Maalox or Mylanta, pepcid.  

## 2020-09-05 NOTE — ED Triage Notes (Signed)
Pt presents to ER w/complaints of epigastric pain that started around 1800 tonight.  Pt states pain is intermittent and worsened by movement.  Pt states she has not had anything like this happen before.  Pt appears uncomfortable in triage.

## 2020-09-12 ENCOUNTER — Emergency Department: Payer: 59

## 2020-09-12 ENCOUNTER — Emergency Department
Admission: EM | Admit: 2020-09-12 | Discharge: 2020-09-12 | Disposition: A | Discharge status: Home / Self Care | Payer: 59 | Attending: Emergency Medicine | Admitting: Emergency Medicine

## 2020-09-12 ENCOUNTER — Other Ambulatory Visit: Payer: Self-pay

## 2020-09-12 DIAGNOSIS — D72829 Elevated white blood cell count, unspecified: Secondary | ICD-10-CM | POA: Diagnosis not present

## 2020-09-12 DIAGNOSIS — R319 Hematuria, unspecified: Secondary | ICD-10-CM | POA: Diagnosis not present

## 2020-09-12 DIAGNOSIS — R110 Nausea: Secondary | ICD-10-CM | POA: Diagnosis not present

## 2020-09-12 DIAGNOSIS — R0602 Shortness of breath: Secondary | ICD-10-CM | POA: Insufficient documentation

## 2020-09-12 DIAGNOSIS — R531 Weakness: Secondary | ICD-10-CM | POA: Insufficient documentation

## 2020-09-12 DIAGNOSIS — R079 Chest pain, unspecified: Secondary | ICD-10-CM | POA: Insufficient documentation

## 2020-09-12 DIAGNOSIS — Z20822 Contact with and (suspected) exposure to covid-19: Secondary | ICD-10-CM | POA: Insufficient documentation

## 2020-09-12 DIAGNOSIS — R350 Frequency of micturition: Secondary | ICD-10-CM | POA: Insufficient documentation

## 2020-09-12 LAB — COMPREHENSIVE METABOLIC PANEL
ALT: 16 U/L (ref 0–44)
AST: 14 U/L — ABNORMAL LOW (ref 15–41)
Albumin: 4.2 g/dL (ref 3.5–5.0)
Alkaline Phosphatase: 78 U/L (ref 38–126)
Anion gap: 10 (ref 5–15)
BUN: 7 mg/dL (ref 6–20)
CO2: 24 mmol/L (ref 22–32)
Calcium: 9.2 mg/dL (ref 8.9–10.3)
Chloride: 107 mmol/L (ref 98–111)
Creatinine, Ser: 0.59 mg/dL (ref 0.44–1.00)
GFR, Estimated: >60 mL/min (ref >60)
Glucose, Bld: 95 mg/dL (ref 70–99)
Potassium: 3.4 mmol/L — ABNORMAL LOW (ref 3.5–5.1)
Sodium: 141 mmol/L (ref 135–145)
Total Bilirubin: 0.5 mg/dL (ref 0.3–1.2)
Total Protein: 8 g/dL (ref 6.5–8.1)

## 2020-09-12 LAB — CBC WITH DIFFERENTIAL/PLATELET
Abs Immature Granulocytes: 0.06 10*3/uL (ref 0.00–0.07)
Basophils Absolute: 0 10*3/uL (ref 0.0–0.1)
Basophils Relative: 0 %
Eosinophils Absolute: 0 10*3/uL (ref 0.0–0.5)
Eosinophils Relative: 0 %
HCT: 38.8 % (ref 36.0–46.0)
Hemoglobin: 13 g/dL (ref 12.0–15.0)
Immature Granulocytes: 1 %
Lymphocytes Relative: 14 %
Lymphs Abs: 1.9 10*3/uL (ref 0.7–4.0)
MCH: 29.6 pg (ref 26.0–34.0)
MCHC: 33.5 g/dL (ref 30.0–36.0)
MCV: 88.4 fL (ref 80.0–100.0)
Monocytes Absolute: 0.7 10*3/uL (ref 0.1–1.0)
Monocytes Relative: 6 %
Neutro Abs: 10.4 10*3/uL — ABNORMAL HIGH (ref 1.7–7.7)
Neutrophils Relative %: 79 %
Platelets: 327 10*3/uL (ref 150–400)
RBC: 4.39 MIL/uL (ref 3.87–5.11)
RDW: 14 % (ref 11.5–15.5)
WBC: 13.2 10*3/uL — ABNORMAL HIGH (ref 4.0–10.5)
nRBC: 0 % (ref 0.0–0.2)

## 2020-09-12 LAB — URINALYSIS, COMPLETE (UACMP) WITH MICROSCOPIC
Bacteria, UA: NONE SEEN
Bilirubin Urine: NEGATIVE
Glucose, UA: NEGATIVE mg/dL
Ketones, ur: NEGATIVE mg/dL
Leukocytes,Ua: NEGATIVE
Nitrite: NEGATIVE
Protein, ur: NEGATIVE mg/dL
Specific Gravity, Urine: 1.001 — ABNORMAL LOW (ref 1.005–1.030)
pH: 7 (ref 5.0–8.0)

## 2020-09-12 LAB — RESP PANEL BY RT-PCR (FLU A&B, COVID) ARPGX2
Influenza A by PCR: NEGATIVE
Influenza B by PCR: NEGATIVE
SARS Coronavirus 2 by RT PCR: NEGATIVE

## 2020-09-12 LAB — LIPASE, BLOOD: Lipase: 36 U/L (ref 11–51)

## 2020-09-12 LAB — TROPONIN I (HIGH SENSITIVITY): Troponin I (High Sensitivity): <2 ng/L (ref <18)

## 2020-09-12 LAB — POC URINE PREG, ED: Preg Test, Ur: NEGATIVE

## 2020-09-12 MED ORDER — ONDANSETRON 4 MG PO TBDP
4.0000 mg | ORAL_TABLET | Freq: Once | ORAL | Status: AC
  Administered 2020-09-12: 4 mg via ORAL
  Filled 2020-09-12: qty 1

## 2020-09-12 NOTE — ED Notes (Signed)
Patient transported to CT 

## 2020-09-12 NOTE — ED Notes (Signed)
ED provider Kennon Holter, PA notified of unsuccessful attempt to start IV and draw labs.   Lab called who will send lab tech to obtain labs. Per provider will evaluate labs prior to attempting IV access again.

## 2020-09-12 NOTE — ED Triage Notes (Signed)
Pt states "I think im having a UTI" having increased urinary frequency.

## 2020-09-12 NOTE — Discharge Instructions (Addendum)
Use Zofran as previously prescribed for nausea. Follow up with primary care. Return to the Er if you develop any worsening of symptoms.

## 2020-09-12 NOTE — ED Notes (Signed)
Lab tech at bedside

## 2020-09-12 NOTE — ED Provider Notes (Signed)
[facility] Emergency Department Provider Note  ____________________________________________   Event Date/Time   First MD Initiated Contact with Patient 09/12/20 1451     (approximate)  I have reviewed the triage vital signs and the nursing notes.   HISTORY  Chief Complaint Urinary Frequency   HPI Lacey Clark is a 30 y.o. female who presents to the ER or multiple medical complaints. She states she began feeling generalized weakness yesterday accompanied by nausea. No vomiting abdominal pain diarrhea or constipation.  She also reports associated urinary frequency that began today.  She denies any dysuria, vaginal discharge, concern for STDs, vaginal itching.  She also denies any associated back pain or fevers.  She does report chills.  She also reports that yesterday and today she has had intermittent episodes of feeling an immense tightness/pressure in her chest that she does not describe as pain.  She states that these episodes are associated with shortness of breath, but that when she takes deep breaths she is able to breathe through this and the symptoms spontaneously resolved.  She also reports increased stress in her life over the last 2 weeks with both of her children having different illnesses.  Of note, the patient was seen in our facility 1 week ago with upper abdominal/epigastric pain radiating into the chest.  Work-up was grossly benign except for cholelithiasis without evidence of cholecystitis.  She states that she took the home medications as directed and was completely improved until yesterday.        Past Medical History:  Diagnosis Date   Medical history non-contributory     Patient Active Problem List   Diagnosis Date Noted   Indication for care in labor and delivery, antepartum 11/14/2018   Pregnancy 10/18/2018    Past Surgical History:  Procedure Laterality Date   NO PAST SURGERIES      Prior to Admission medications    Medication Sig Start Date End Date Taking? Authorizing Provider  dicyclomine (BENTYL) 20 MG tablet Take 1 tablet (20 mg total) by mouth every 8 (eight) hours as needed for spasms (Abdominal cramping). 09/05/20   Ward, Layla Maw, DO  ferrous sulfate 324 MG TBEC Take 324 mg by mouth.    [provider]  ondansetron (ZOFRAN ODT) 4 MG disintegrating tablet Take 1 tablet (4 mg total) by mouth every 6 (six) hours as needed for nausea or vomiting. 09/05/20   Ward, Layla Maw, DO  pantoprazole (PROTONIX) 40 MG tablet Take 1 tablet (40 mg total) by mouth daily. 09/05/20 11/04/20  Ward, Layla Maw, DO  Prenatal Vit-Fe Fumarate-FA (MULTIVITAMIN-PRENATAL) 27-0.8 MG TABS tablet Take 1 tablet by mouth daily at 12 noon.    [provider]    Allergies Patient has no known allergies.  No family history on file.  Social History Social History   Tobacco Use   Smoking status: Never   Smokeless tobacco: Never  Vaping Use   Vaping Use: Never used  Substance Use Topics   Alcohol use: Never   Drug use: Never    Review of Systems Constitutional: No fever, +chills Eyes: No visual changes. ENT: No sore throat. Cardiovascular: + Intermittent chest pressure, denies chest pain. Respiratory: + Intermittent shortness of breath. Gastrointestinal: No abdominal pain.  + nausea, no vomiting.  No diarrhea.  No constipation. Genitourinary: + Urinary frequency, negative for dysuria, negative for vaginal discharge Musculoskeletal: Negative for back pain. Skin: Negative for rash. Neurological: Negative for headaches, focal weakness or numbness.  ____________________________________________  PHYSICAL EXAM:  VITAL SIGNS: ED Triage Vitals  Enc Vitals Group     BP 09/12/20 1315 115/63     Pulse Rate 09/12/20 1315 82     Resp 09/12/20 1315 20     Temp 09/12/20 1315 98.6 F (37 C)     Temp Source 09/12/20 1315 Oral     SpO2 09/12/20 1315 100 %     Weight 09/12/20 1316 174 lb (78.9 kg)      Height 09/12/20 1316 5\' 3"  (1.6 m)     Head Circumference --      Peak Flow --      Pain Score --      Pain Loc --      Pain Edu? --      Excl. in GC? --    Constitutional: Alert and oriented. Well appearing and in no acute distress. Eyes: Conjunctivae are normal. PERRL. EOMI. Head: Atraumatic. Nose: No congestion/rhinnorhea. Mouth/Throat: Mucous membranes are moist.  Oropharynx mildly erythematous with no tonsillar enlargement or exudate. Neck: No stridor.   Cardiovascular: Normal rate, regular rhythm. Grossly normal heart sounds.  Good peripheral circulation. Respiratory: Normal respiratory effort.  No retractions. Lungs CTAB. Gastrointestinal: Soft and nontender. No distention. No abdominal bruits. No CVA tenderness. Musculoskeletal: No lower extremity tenderness nor edema.  No joint effusions. Neurologic:  Normal speech and language. No gross focal neurologic deficits are appreciated. No gait instability. Skin:  Skin is warm, dry and intact. No rash noted. Psychiatric: Mood and affect are normal. Speech and behavior are normal.  ____________________________________________   LABS (all labs ordered are listed, but only abnormal results are displayed)  Labs Reviewed  URINALYSIS, COMPLETE (UACMP) WITH MICROSCOPIC - Abnormal; Notable for the following components:      Result Value   Color, Urine COLORLESS (*)    APPearance CLEAR (*)    Specific Gravity, Urine 1.001 (*)    Hgb urine dipstick SMALL (*)    All other components within normal limits  COMPREHENSIVE METABOLIC PANEL - Abnormal; Notable for the following components:   Potassium 3.4 (*)    AST 14 (*)    All other components within normal limits  CBC WITH DIFFERENTIAL/PLATELET - Abnormal; Notable for the following components:   WBC 13.2 (*)    Neutro Abs 10.4 (*)    All other components within normal limits  RESP PANEL BY RT-PCR (FLU A&B, COVID) ARPGX2  LIPASE, BLOOD  POC URINE PREG, ED  TROPONIN I (HIGH  SENSITIVITY)   ____________________________________________  EKG  Normal sinus rhythm with a rate of 79 bpm.  Normal QTC.  Normal axis.  No ST segment elevations or depressions. ____________________________________________  RADIOLOGY  Official radiology report(s): DG Chest 2 View  Result Date: 09/12/2020 CLINICAL DATA:  Weakness, shortness of breath EXAM: CHEST - 2 VIEW COMPARISON:  None. FINDINGS: The heart size and mediastinal contours are within normal limits. No focal airspace consolidation, pleural effusion, or pneumothorax. The visualized skeletal structures are unremarkable. IMPRESSION: No active cardiopulmonary disease. Electronically Signed   By: 09/14/2020 D.O.   On: 09/12/2020 16:12   CT Renal Stone Study  Result Date: 09/12/2020 CLINICAL DATA:  Hematuria, unknown cause EXAM: CT ABDOMEN AND PELVIS WITHOUT CONTRAST TECHNIQUE: Multidetector CT imaging of the abdomen and pelvis was performed following the standard protocol without IV contrast. COMPARISON:  None. FINDINGS: Lower chest: No acute abnormality. Hepatobiliary: Cholelithiasis. Unremarkable noncontrast appearance of the liver. Pancreas: No peripancreatic fat stranding. Spleen: Unremarkable. Adrenals/Urinary Tract: Adrenal glands are unremarkable.  No hydronephrosis. No nephrolithiasis. Bladder is unremarkable. Stomach/Bowel: Stomach is within normal limits. Appendix appears normal. No evidence of bowel wall thickening, distention, or inflammatory changes. Vascular/Lymphatic: No significant vascular findings are present. No enlarged abdominal or pelvic lymph nodes. Reproductive: Uterus and bilateral adnexa are unremarkable. Other: No abdominal wall hernia or abnormality. No abdominopelvic ascites. Musculoskeletal: No acute or significant osseous findings. IMPRESSION: No CT etiology for hematuria identified.  No nephrolithiasis. Electronically Signed   By: Meda Klinefelter MD   On: 09/12/2020 18:11     ____________________________________   INITIAL IMPRESSION / ASSESSMENT AND PLAN / ED COURSE  As part of my medical decision making, I reviewed the following data within the electronic MEDICAL RECORD NUMBER Nursing notes reviewed and incorporated, Labs reviewed, Radiograph reviewed, and Notes from prior ED visits        Patient is a 30 year old female who reports to the emergency department for multiple symptoms that started yesterday including nausea and urinary frequency.  See HPI for further details.  In triage patient has normal vital signs.  Physical exam as above.  In regards to the patient's nausea, she has not had any fever, chills, dysuria or abdominal pain.  She is endorsing intermittent chest pain with shortness of breath.  On exam, she has normal heart and lung sounds.  She is nontender to palpate anywhere in the abdomen.  In regards to the urinary frequency, she specifically denies any dysuria or changes in vaginal discharge.  She adamantly denies any concerns for STDs or yeast infection.  Records were reviewed from patient's prior ED visit 1 week ago at which time she was diagnosed with symptomatic cholelithiasis with GERD.  She does finally admit to me that she quit taking the Zofran that she was prescribed 2 days ago, and feels this could be related to her nausea.  She has been increasing her oral fluid intake secondary to generally not feeling well, which could be related to her urinary frequency.  Laboratory evaluation includes CBC, CMP, troponin, lipase.  These were grossly normal except for mild leukocytosis of 13.2, however this is improved from patient's leukocytosis of sixteen 1 week ago.  Urinalysis does not have any bacteria, leukocytes or nitrites but does show evidence of hemoglobin, which was also present 1 week ago.  EKG within normal limits.  Chest x-ray within normal limits.  Given the patient's urinary frequency with previous abdominal pain now with persistent nausea, I  did do a CT renal stone study to rule out nephrolithiasis related to the patient's symptoms and this is within normal limits.  While here, the patient was able to tolerate p.o. intake after treatment with ODT Zofran.  Encourage the patient to use these as needed over the next few days to continue to increase oral intake as well as treatment with antacids for GERD.  Recommended follow-up with her PCP for further evaluation if symptoms persisted.  Return precautions were discussed at length and she stable this time for outpatient follow-up.        ____________________________________________   FINAL CLINICAL IMPRESSION(S) / ED DIAGNOSES  Final diagnoses:  Nausea  Urinary frequency     ED Discharge Orders     None        Note:  This document was prepared using Dragon voice recognition software and may include unintentional dictation errors.    Lucy Chris, PA 09/12/20 2259    Delton Prairie, MD 09/13/20 [phone]

## 2020-09-12 NOTE — ED Notes (Signed)
Patient transported to X-ray 

## 2020-09-25 ENCOUNTER — Ambulatory Visit: Payer: Self-pay | Admitting: General Surgery

## 2020-09-25 NOTE — H&P (Signed)
PATIENT PROFILE: Lacey Clark is a 30 y.o. female who presents to the Clinic for consultation at the request of Dr. Letta Pate for evaluation of cholelithiasis.  PCP:  [facility]  HISTORY OF PRESENT ILLNESS: Ms. Lacey Clark reports she has been having right upper quadrant pain since the last month.  Specifically she had 1 episode of severe epigastric pain that radiated to the right upper quadrant.  Pain exacerbated by eating.  There has been no alleviating factors.  Patient has since having intermittent milder pain in the same area.  Patient denies any fever or chills.  Patient went to the ED for evaluation of the severe right upper quadrant pain.  At the ED she had labs that shows white blood cell count of 13,000.  She also had an ultrasound of the abdomen that shows cholelithiasis without any sign of cholecystitis.  I personally evaluated the images.  Liver enzymes were within normal limits.   PROBLEM LIST: Cholelithiasis  GENERAL REVIEW OF SYSTEMS:   General ROS: negative for - chills, fatigue, fever, weight gain or weight loss Allergy and Immunology ROS: negative for - hives  Hematological and Lymphatic ROS: negative for - bleeding problems or bruising, negative for palpable nodes Endocrine ROS: negative for - heat or cold intolerance, hair changes Respiratory ROS: negative for - cough, shortness of breath or wheezing Cardiovascular ROS: no chest pain or palpitations GI ROS: negative for nausea, vomiting, diarrhea, constipation.  Positive for abdominal pain Musculoskeletal ROS: negative for - joint swelling or muscle pain Neurological ROS: negative for - confusion, syncope Dermatological ROS: negative for pruritus and rash Psychiatric: negative for anxiety, depression, difficulty sleeping and memory loss  MEDICATIONS: Current Outpatient Medications  Medication Sig Dispense Refill   medroxyPROGESTERone (DEPO-PROVERA) 150 mg/mL injection Inject 150  mg into the muscle every 3 (three) months.     pantoprazole (PROTONIX) 40 MG DR tablet Take 40 mg by mouth once daily     No current facility-administered medications for this visit.    ALLERGIES: Patient has no known allergies.  PAST MEDICAL HISTORY: Past Medical History:  Diagnosis Date   HPV (human papilloma virus) infection    Pap smear abnormality of cervix with LGSIL     PAST SURGICAL HISTORY: Past Surgical History:  Procedure Laterality Date   CERVICAL BIOPSY  W/ LOOP ELECTRODE EXCISION     COLPOSCOPY  2016     FAMILY HISTORY: Family History  Problem Relation Age of Onset   No Known Problems Mother    No Known Problems Father      SOCIAL HISTORY: Social History   Socioeconomic History   Marital status: Single  Tobacco Use   Smoking status: Never Smoker   Smokeless tobacco: Never Used  Building services engineer Use: Never used  Substance and Sexual Activity   Alcohol use: No   Drug use: No   Sexual activity: Yes    Partners: Male    Birth control/protection: Injection    PHYSICAL EXAM: Vitals:   09/25/20 1542  BP: 121/69  Pulse: 90   Body mass index is 24.2 kg/m. Weight: 64 kg (141 lb)   GENERAL: Alert, active, oriented x3  HEENT: Pupils equal reactive to light. Extraocular movements are intact. Sclera clear. Palpebral conjunctiva normal red color.Pharynx clear.  NECK: Supple with no palpable mass and no adenopathy.  LUNGS: Sound clear with no rales rhonchi or wheezes.  HEART: Regular rhythm S1 and S2 without murmur.  ABDOMEN: Soft and depressible, nontender  with no palpable mass, no hepatomegaly.   EXTREMITIES: Well-developed well-nourished symmetrical with no dependent edema.  NEUROLOGICAL: Awake alert oriented, facial expression symmetrical, moving all extremities.  REVIEW OF DATA: I have reviewed the following data today: No visits with results within 3 Month(s) from this visit.  Latest known visit with results is:  Procedure visit on  02/08/2019  Component Date Value   Diagnosis Synopsis: - [facility]* 02/08/2019 See below: (!)   Specimen: - [facility] 02/08/2019 Comment    DIAGNOSIS: - [facility] 02/08/2019 See below: Michaell Cowing description: - Lab* 02/08/2019 Comment    Electronically Signed by* 02/08/2019 Comment    CPT Code(s): - [facility] 02/08/2019 Comment    CPT Disclaimer: - [facility] 02/08/2019 Comment    Clinical Provided ICD: -* 02/08/2019 Comment    Pathologist Provided ICD* 02/08/2019 N87.0    PDF Image - Verdell Carmine 02/08/2019 .      ASSESSMENT: Ms. Lacey Clark is a 30 y.o. female presenting for consultation for cholelithiasis.    Patient was oriented about the diagnosis of cholelithiasis. Also oriented about what is the gallbladder, its anatomy and function and the implications of having stones / gallbladder low ejection fraction. The patient was oriented about the treatment alternatives (observation vs cholecystectomy). Patient was oriented that a low percentage of patient will continue to have similar pain symptoms even after the gallbladder is removed. Surgical technique (open vs laparoscopic) was discussed. It was also discussed the goals of the surgery (decrease the pain episodes and avoid the risk of cholecystitis) and the risk of surgery including: bleeding, infection, common bile duct injury, stone retention, injury to other organs such as bowel, liver, stomach, other complications such as hernia, bowel obstruction among others. Also discussed with patient about anesthesia and its complications such as: reaction to medications, pneumonia, heart complications, death, among others.   Cholelithiasis without cholecystitis [K80.20]  PLAN: 1.  Robotic assisted laparoscopic cholecystectomy (01751) 2.  CBC, CMP (done) 3.  Do not take aspirin 5 days before the procedure 4.  Contact us if has any question or concern.   Patient verbalized understanding, all questions were answered, and were agreeable with the plan outlined  above.     Carolan Shiver, MD  Electronically signed by Carolan Shiver, MD

## 2020-09-27 ENCOUNTER — Encounter
Admission: EM | Disposition: A | Discharge status: Home / Self Care | Payer: Self-pay | Source: Home / Self Care | Attending: Emergency Medicine

## 2020-09-27 ENCOUNTER — Observation Stay
Admission: EM | Admit: 2020-09-27 | Discharge: 2020-09-28 | Disposition: A | Discharge status: Home / Self Care | Payer: 59 | Attending: General Surgery | Admitting: General Surgery

## 2020-09-27 ENCOUNTER — Observation Stay: Payer: 59 | Admitting: Anesthesiology

## 2020-09-27 ENCOUNTER — Other Ambulatory Visit: Payer: Self-pay

## 2020-09-27 ENCOUNTER — Inpatient Hospital Stay: Admission: RE | Admit: 2020-09-27 | Payer: Medicaid Other | Source: Ambulatory Visit

## 2020-09-27 ENCOUNTER — Encounter: Payer: Self-pay | Admitting: Emergency Medicine

## 2020-09-27 ENCOUNTER — Emergency Department: Payer: 59

## 2020-09-27 DIAGNOSIS — R1011 Right upper quadrant pain: Secondary | ICD-10-CM

## 2020-09-27 DIAGNOSIS — K810 Acute cholecystitis: Secondary | ICD-10-CM | POA: Diagnosis present

## 2020-09-27 DIAGNOSIS — Z20822 Contact with and (suspected) exposure to covid-19: Secondary | ICD-10-CM | POA: Diagnosis not present

## 2020-09-27 DIAGNOSIS — K8050 Calculus of bile duct without cholangitis or cholecystitis without obstruction: Secondary | ICD-10-CM

## 2020-09-27 DIAGNOSIS — K8012 Calculus of gallbladder with acute and chronic cholecystitis without obstruction: Secondary | ICD-10-CM | POA: Diagnosis not present

## 2020-09-27 LAB — COMPREHENSIVE METABOLIC PANEL
ALT: 19 U/L (ref 0–44)
AST: 18 U/L (ref 15–41)
Albumin: 4.4 g/dL (ref 3.5–5.0)
Alkaline Phosphatase: 85 U/L (ref 38–126)
Anion gap: 11 (ref 5–15)
BUN: 9 mg/dL (ref 6–20)
CO2: 19 mmol/L — ABNORMAL LOW (ref 22–32)
Calcium: 9.4 mg/dL (ref 8.9–10.3)
Chloride: 108 mmol/L (ref 98–111)
Creatinine, Ser: 0.64 mg/dL (ref 0.44–1.00)
GFR, Estimated: >60 mL/min (ref >60)
Glucose, Bld: 96 mg/dL (ref 70–99)
Potassium: 3.9 mmol/L (ref 3.5–5.1)
Sodium: 138 mmol/L (ref 135–145)
Total Bilirubin: 0.8 mg/dL (ref 0.3–1.2)
Total Protein: 8.3 g/dL — ABNORMAL HIGH (ref 6.5–8.1)

## 2020-09-27 LAB — URINALYSIS, COMPLETE (UACMP) WITH MICROSCOPIC
Bilirubin Urine: NEGATIVE
Glucose, UA: NEGATIVE mg/dL
Ketones, ur: 20 mg/dL — AB
Leukocytes,Ua: NEGATIVE
Nitrite: NEGATIVE
Protein, ur: NEGATIVE mg/dL
Specific Gravity, Urine: 1.006 (ref 1.005–1.030)
pH: 6 (ref 5.0–8.0)

## 2020-09-27 LAB — LIPASE, BLOOD: Lipase: 46 U/L (ref 11–51)

## 2020-09-27 LAB — CBC
HCT: 39.5 % (ref 36.0–46.0)
Hemoglobin: 13.7 g/dL (ref 12.0–15.0)
MCH: 30.2 pg (ref 26.0–34.0)
MCHC: 34.7 g/dL (ref 30.0–36.0)
MCV: 87 fL (ref 80.0–100.0)
Platelets: 325 10*3/uL (ref 150–400)
RBC: 4.54 MIL/uL (ref 3.87–5.11)
RDW: 14.4 % (ref 11.5–15.5)
WBC: 14.7 10*3/uL — ABNORMAL HIGH (ref 4.0–10.5)
nRBC: 0 % (ref 0.0–0.2)

## 2020-09-27 LAB — RESP PANEL BY RT-PCR (FLU A&B, COVID) ARPGX2
Influenza A by PCR: NEGATIVE
Influenza B by PCR: NEGATIVE
SARS Coronavirus 2 by RT PCR: NEGATIVE

## 2020-09-27 LAB — POC URINE PREG, ED: Preg Test, Ur: NEGATIVE

## 2020-09-27 SURGERY — CHOLECYSTECTOMY, ROBOT-ASSISTED, LAPAROSCOPIC
Anesthesia: General | Site: Abdomen

## 2020-09-27 MED ORDER — PROPOFOL 10 MG/ML IV BOLUS
INTRAVENOUS | Status: DC | PRN
  Administered 2020-09-27: 150 mg via INTRAVENOUS

## 2020-09-27 MED ORDER — ACETAMINOPHEN 650 MG RE SUPP: 650.0000 mg | Freq: Four times a day (QID) | RECTAL | Status: DC | PRN

## 2020-09-27 MED ORDER — KETOROLAC TROMETHAMINE 30 MG/ML IJ SOLN
30.0000 mg | Freq: Four times a day (QID) | INTRAMUSCULAR | Status: DC
  Administered 2020-09-27 – 2020-09-28 (×2): 30 mg via INTRAVENOUS
  Filled 2020-09-27 (×2): qty 1

## 2020-09-27 MED ORDER — BUPIVACAINE-EPINEPHRINE (PF) 0.25% -1:200000 IJ SOLN
INTRAMUSCULAR | Status: AC
  Filled 2020-09-27: qty 30

## 2020-09-27 MED ORDER — INDOCYANINE GREEN 25 MG IV SOLR
1.2500 mg | Freq: Once | INTRAVENOUS | Status: AC
  Administered 2020-09-27: 1.25 mg via INTRAVENOUS
  Filled 2020-09-27: qty 0.5

## 2020-09-27 MED ORDER — ACETAMINOPHEN 325 MG PO TABS: 650.0000 mg | ORAL_TABLET | Freq: Four times a day (QID) | ORAL | Status: DC | PRN

## 2020-09-27 MED ORDER — SUGAMMADEX SODIUM 200 MG/2ML IV SOLN
INTRAVENOUS | Status: DC | PRN
  Administered 2020-09-27: 200 mg via INTRAVENOUS

## 2020-09-27 MED ORDER — FENTANYL CITRATE (PF) 100 MCG/2ML IJ SOLN
INTRAMUSCULAR | Status: AC
  Administered 2020-09-27: 50 ug via INTRAVENOUS
  Filled 2020-09-27: qty 2

## 2020-09-27 MED ORDER — ENOXAPARIN SODIUM 40 MG/0.4ML IJ SOSY
40.0000 mg | PREFILLED_SYRINGE | INTRAMUSCULAR | Status: DC
  Administered 2020-09-28: 40 mg via SUBCUTANEOUS
  Filled 2020-09-27: qty 0.4

## 2020-09-27 MED ORDER — HYDROCODONE-ACETAMINOPHEN 5-325 MG PO TABS: 1.0000 | ORAL_TABLET | ORAL | Status: DC | PRN

## 2020-09-27 MED ORDER — PANTOPRAZOLE SODIUM 40 MG IV SOLR
40.0000 mg | Freq: Every day | INTRAVENOUS | Status: DC
  Administered 2020-09-27: 40 mg via INTRAVENOUS
  Filled 2020-09-27: qty 40

## 2020-09-27 MED ORDER — HYDROMORPHONE HCL 1 MG/ML IJ SOLN
INTRAMUSCULAR | Status: DC | PRN
  Administered 2020-09-27 (×2): .5 mg via INTRAVENOUS

## 2020-09-27 MED ORDER — BUPIVACAINE-EPINEPHRINE 0.25% -1:200000 IJ SOLN
INTRAMUSCULAR | Status: DC | PRN
  Administered 2020-09-27: 30 mL

## 2020-09-27 MED ORDER — OXYCODONE HCL 5 MG PO TABS
ORAL_TABLET | ORAL | Status: AC
  Filled 2020-09-27: qty 1

## 2020-09-27 MED ORDER — SODIUM CHLORIDE 0.9 % IV SOLN: INTRAVENOUS | Status: DC

## 2020-09-27 MED ORDER — ACETAMINOPHEN 10 MG/ML IV SOLN
INTRAVENOUS | Status: DC | PRN
  Administered 2020-09-27: 1000 mg via INTRAVENOUS

## 2020-09-27 MED ORDER — CEFAZOLIN SODIUM-DEXTROSE 2-4 GM/100ML-% IV SOLN
INTRAVENOUS | Status: AC
  Filled 2020-09-27: qty 100

## 2020-09-27 MED ORDER — 0.9 % SODIUM CHLORIDE (POUR BTL) OPTIME
TOPICAL | Status: DC | PRN
  Administered 2020-09-27: 500 mL

## 2020-09-27 MED ORDER — ROCURONIUM BROMIDE 100 MG/10ML IV SOLN
INTRAVENOUS | Status: DC | PRN
  Administered 2020-09-27: 10 mg via INTRAVENOUS
  Administered 2020-09-27: 50 mg via INTRAVENOUS

## 2020-09-27 MED ORDER — LIDOCAINE HCL (CARDIAC) PF 100 MG/5ML IV SOSY
PREFILLED_SYRINGE | INTRAVENOUS | Status: DC | PRN
  Administered 2020-09-27: 100 mg via INTRAVENOUS

## 2020-09-27 MED ORDER — PIPERACILLIN-TAZOBACTAM 3.375 G IVPB
3.3750 g | Freq: Three times a day (TID) | INTRAVENOUS | Status: DC
  Administered 2020-09-27 – 2020-09-28 (×2): 3.375 g via INTRAVENOUS
  Filled 2020-09-27 (×2): qty 50

## 2020-09-27 MED ORDER — MIDAZOLAM HCL 2 MG/2ML IJ SOLN
INTRAMUSCULAR | Status: DC | PRN
  Administered 2020-09-27: 2 mg via INTRAVENOUS

## 2020-09-27 MED ORDER — DEXAMETHASONE SODIUM PHOSPHATE 10 MG/ML IJ SOLN
INTRAMUSCULAR | Status: DC | PRN
  Administered 2020-09-27: 10 mg via INTRAVENOUS

## 2020-09-27 MED ORDER — KETOROLAC TROMETHAMINE 30 MG/ML IJ SOLN
INTRAMUSCULAR | Status: DC | PRN
  Administered 2020-09-27: 30 mg via INTRAVENOUS

## 2020-09-27 MED ORDER — FENTANYL CITRATE (PF) 100 MCG/2ML IJ SOLN
INTRAMUSCULAR | Status: DC | PRN
  Administered 2020-09-27 (×2): 50 ug via INTRAVENOUS

## 2020-09-27 MED ORDER — LACTATED RINGERS IV SOLN: INTRAVENOUS | Status: DC | PRN

## 2020-09-27 MED ORDER — ONDANSETRON HCL 4 MG/2ML IJ SOLN: 4.0000 mg | Freq: Four times a day (QID) | INTRAMUSCULAR | Status: DC | PRN

## 2020-09-27 MED ORDER — OXYCODONE HCL 5 MG PO TABS
5.0000 mg | ORAL_TABLET | Freq: Once | ORAL | Status: AC
  Administered 2020-09-27: 5 mg via ORAL

## 2020-09-27 MED ORDER — CEFAZOLIN SODIUM-DEXTROSE 2-4 GM/100ML-% IV SOLN
2.0000 g | INTRAVENOUS | Status: AC
  Administered 2020-09-27: 2 g via INTRAVENOUS

## 2020-09-27 MED ORDER — MORPHINE SULFATE (PF) 4 MG/ML IV SOLN: 4.0000 mg | INTRAVENOUS | Status: DC | PRN

## 2020-09-27 MED ORDER — PROMETHAZINE HCL 25 MG/ML IJ SOLN: 6.2500 mg | INTRAMUSCULAR | Status: DC | PRN

## 2020-09-27 MED ORDER — MORPHINE SULFATE (PF) 4 MG/ML IV SOLN
INTRAVENOUS | Status: AC
  Administered 2020-09-27: 4 mg via INTRAVENOUS
  Filled 2020-09-27: qty 1

## 2020-09-27 MED ORDER — MORPHINE SULFATE (PF) 4 MG/ML IV SOLN
4.0000 mg | Freq: Once | INTRAVENOUS | Status: AC
  Administered 2020-09-27: 4 mg via INTRAVENOUS
  Filled 2020-09-27: qty 1

## 2020-09-27 MED ORDER — ONDANSETRON HCL 4 MG/2ML IJ SOLN
INTRAMUSCULAR | Status: DC | PRN
  Administered 2020-09-27: 4 mg via INTRAVENOUS

## 2020-09-27 MED ORDER — LACTATED RINGERS IV BOLUS
1000.0000 mL | Freq: Once | INTRAVENOUS | Status: AC
  Administered 2020-09-27: 1000 mL via INTRAVENOUS

## 2020-09-27 MED ORDER — SUCCINYLCHOLINE CHLORIDE 200 MG/10ML IV SOSY
PREFILLED_SYRINGE | INTRAVENOUS | Status: DC | PRN
  Administered 2020-09-27: 100 mg via INTRAVENOUS

## 2020-09-27 MED ORDER — ONDANSETRON HCL 4 MG/2ML IJ SOLN
4.0000 mg | Freq: Once | INTRAMUSCULAR | Status: AC
  Administered 2020-09-27: 4 mg via INTRAVENOUS
  Filled 2020-09-27: qty 2

## 2020-09-27 MED ORDER — ONDANSETRON 4 MG PO TBDP: 4.0000 mg | ORAL_TABLET | Freq: Four times a day (QID) | ORAL | Status: DC | PRN

## 2020-09-27 MED ORDER — FENTANYL CITRATE (PF) 100 MCG/2ML IJ SOLN
25.0000 ug | INTRAMUSCULAR | Status: DC | PRN
  Administered 2020-09-27: 50 ug via INTRAVENOUS

## 2020-09-27 SURGICAL SUPPLY — 48 items
BAG INFUSER PRESSURE 100CC (MISCELLANEOUS) IMPLANT
BLADE SURG SZ11 CARB STEEL (BLADE) ×2 IMPLANT
CANISTER SUCT 1200ML W/VALVE (MISCELLANEOUS) ×2 IMPLANT
CANNULA REDUC XI 12-8 STAPL (CANNULA) ×1
CANNULA REDUCER 12-8 DVNC XI (CANNULA) ×1 IMPLANT
CHLORAPREP W/TINT 26 (MISCELLANEOUS) ×2 IMPLANT
CLIP VESOLOCK MED LG 6/CT (CLIP) ×2 IMPLANT
DECANTER SPIKE VIAL GLASS SM (MISCELLANEOUS) ×4 IMPLANT
DEFOGGER SCOPE WARMER CLEARIFY (MISCELLANEOUS) ×2 IMPLANT
DERMABOND ADVANCED (GAUZE/BANDAGES/DRESSINGS) ×1
DERMABOND ADVANCED .7 DNX12 (GAUZE/BANDAGES/DRESSINGS) ×1 IMPLANT
DRAPE ARM DVNC X/XI (DISPOSABLE) ×4 IMPLANT
DRAPE COLUMN DVNC XI (DISPOSABLE) ×1 IMPLANT
DRAPE DA VINCI XI ARM (DISPOSABLE) ×4
DRAPE DA VINCI XI COLUMN (DISPOSABLE) ×1
ELECT REM PT RETURN 9FT ADLT (ELECTROSURGICAL) ×2
ELECTRODE REM PT RTRN 9FT ADLT (ELECTROSURGICAL) ×1 IMPLANT
GAUZE 4X4 16PLY ~~LOC~~+RFID DBL (SPONGE) ×2 IMPLANT
GLOVE SURG ENC MOIS LTX SZ6.5 (GLOVE) ×4 IMPLANT
GLOVE SURG UNDER POLY LF SZ6.5 (GLOVE) ×4 IMPLANT
GOWN STRL REUS W/ TWL LRG LVL3 (GOWN DISPOSABLE) ×3 IMPLANT
GOWN STRL REUS W/TWL LRG LVL3 (GOWN DISPOSABLE) ×3
GRASPER SUT TROCAR 14GX15 (MISCELLANEOUS) ×2 IMPLANT
IV NS 1000ML (IV SOLUTION)
IV NS 1000ML BAXH (IV SOLUTION) IMPLANT
KIT PINK PAD W/HEAD ARE REST (MISCELLANEOUS) ×2 IMPLANT
KIT PINK PAD W/HEAD ARM REST (MISCELLANEOUS) ×1 IMPLANT
LABEL OR SOLS (LABEL) ×2 IMPLANT
MANIFOLD NEPTUNE II (INSTRUMENTS) ×2 IMPLANT
NEEDLE HYPO 22GX1.5 SAFETY (NEEDLE) ×2 IMPLANT
NEEDLE INSUFFLATION 14GA 120MM (NEEDLE) ×2 IMPLANT
NS IRRIG 500ML POUR BTL (IV SOLUTION) ×2 IMPLANT
OBTURATOR OPTICAL STANDARD 8MM (TROCAR) ×1
OBTURATOR OPTICAL STND 8 DVNC (TROCAR) ×1
OBTURATOR OPTICALSTD 8 DVNC (TROCAR) ×1 IMPLANT
PACK LAP CHOLECYSTECTOMY (MISCELLANEOUS) ×2 IMPLANT
POUCH SPECIMEN RETRIEVAL 10MM (ENDOMECHANICALS) ×2 IMPLANT
SEAL CANN UNIV 5-8 DVNC XI (MISCELLANEOUS) ×3 IMPLANT
SEAL XI 5MM-8MM UNIVERSAL (MISCELLANEOUS) ×3
SET TUBE SMOKE EVAC HIGH FLOW (TUBING) ×2 IMPLANT
SOLUTION ELECTROLUBE (MISCELLANEOUS) ×2 IMPLANT
SPONGE T-LAP 4X18 ~~LOC~~+RFID (SPONGE) IMPLANT
STAPLER CANNULA SEAL DVNC XI (STAPLE) ×1 IMPLANT
STAPLER CANNULA SEAL XI (STAPLE) ×1
SUT MNCRL 4-0 (SUTURE) ×1
SUT MNCRL 4-0 27XMFL (SUTURE) ×1
SUT VICRYL 0 AB UR-6 (SUTURE) ×2 IMPLANT
SUTURE MNCRL 4-0 27XMF (SUTURE) ×1 IMPLANT

## 2020-09-27 NOTE — ED Notes (Signed)
After admin of IV morphine... pt states "I feel very funny and lightheaded" RN advised this can be normal. Laid pt head back... obtained vitals and placed on cardiac monitor. No hives, no itching and no SOB. After a few minutes of monitoring, pt advised she was feeling better.

## 2020-09-27 NOTE — ED Notes (Signed)
Same say surgery called for update on PT as they will be sending someone to come and get her for surgery.

## 2020-09-27 NOTE — H&P (Signed)
SURGICAL CONSULTATION NOTE   HISTORY OF PRESENT ILLNESS (HPI):  30 y.o. female presented to [facility] ED for evaluation of abdominal pain that started this morning at 2 AM. Patient reports pain at the epigastric area that radiates to the right upper quadrant.  Pain radiates to her right back.  Pain goes severe to 10 out of 10.  Minimal improvement with morphine at the ED.  Patient at the ED had ultrasound that showed cholelithiasis without sign of cholecystitis.  I personally evaluated the images.  Patient with leukocytosis.  Patient had received morphine without significant improvement of abdominal pain.  Surgery is consulted by Dr. Larinda Buttery in this context for evaluation and management of acute cholecystitis.  PAST MEDICAL HISTORY (PMH):  Past Medical History:  Diagnosis Date   Medical history non-contributory      PAST SURGICAL HISTORY (PSH):  Past Surgical History:  Procedure Laterality Date   NO PAST SURGERIES       MEDICATIONS:  Prior to Admission medications   Medication Sig Start Date End Date Taking? Authorizing Provider  dicyclomine (BENTYL) 20 MG tablet Take 1 tablet (20 mg total) by mouth every 8 (eight) hours as needed for spasms (Abdominal cramping). Patient not taking: No sig reported 09/05/20   Ward, Baxter Hire N, DO  ondansetron (ZOFRAN ODT) 4 MG disintegrating tablet Take 1 tablet (4 mg total) by mouth every 6 (six) hours as needed for nausea or vomiting. 09/05/20   Ward, Layla Maw, DO  pantoprazole (PROTONIX) 40 MG tablet Take 1 tablet (40 mg total) by mouth daily. 09/05/20 11/04/20  Ward, Layla Maw, DO     ALLERGIES:  No Known Allergies   SOCIAL HISTORY:  Social History   Socioeconomic History   Marital status: Married    Spouse name: Durel Salts   Number of children: Not on file   Years of education: Not on file   Highest education level: Not on file  Occupational History   Not on file  Tobacco Use   Smoking status: Never   Smokeless tobacco: Never  Vaping Use    Vaping Use: Never used  Substance and Sexual Activity   Alcohol use: Never   Drug use: Never   Sexual activity: Not Currently    Birth control/protection: I.U.D.  Other Topics Concern   Not on file  Social History Narrative   Not on file   Social Determinants of Health   Financial Resource Strain: Not on file  Food Insecurity: Not on file  Transportation Needs: Not on file  Physical Activity: Not on file  Stress: Not on file  Social Connections: Not on file  Intimate Partner Violence: Not on file     FAMILY HISTORY:  No family history on file.   REVIEW OF SYSTEMS:  Constitutional: denies weight loss, fever, chills, or sweats  Eyes: denies any other vision changes, history of eye injury  ENT: denies sore throat, hearing problems  Respiratory: denies shortness of breath, wheezing  Cardiovascular: denies chest pain, palpitations  Gastrointestinal: positive abdominal pain, nausea and vomitnig Genitourinary: denies burning with urination or urinary frequency Musculoskeletal: denies any other joint pains or cramps  Skin: denies any other rashes or skin discolorations  Neurological: denies any other headache, dizziness, weakness  Psychiatric: denies any other depression, anxiety   All other review of systems were negative   VITAL SIGNS:  Temp:  [98.2 F (36.8 C)] 98.2 F (36.8 C) (08/12 1114) Pulse Rate:  [71-106] 89 (08/12 1300) Resp:  [17-22] 19 (08/12 1300) BP: (  122-127)/(66-85) 127/66 (08/12 1300) SpO2:  [99 %-100 %] 100 % (08/12 1300) Weight:  [78.9 kg] 78.9 kg (08/12 1114)     Height: 5\' 3"  (160 cm) Weight: 78.9 kg BMI (Calculated): 30.83   INTAKE/OUTPUT:  This shift: Total I/O In: 1000 [IV Piggyback:1000] Out: -   Last 2 shifts: @IOLAST2SHIFTS @   PHYSICAL EXAM:  Constitutional:  -- Normal body habitus  -- Awake, alert, and oriented x3  Eyes:  -- Pupils equally round and reactive to light  -- No scleral icterus  Ear, nose, and throat:  -- No jugular  venous distension  Pulmonary:  -- No crackles  -- Equal breath sounds bilaterally -- Breathing non-labored at rest Cardiovascular:  -- S1, S2 present  -- No pericardial rubs Gastrointestinal:  -- Abdomen soft, tender to palpation in the right upper quadrant, non-distended, no guarding or rebound tenderness -- No abdominal masses appreciated, pulsatile or otherwise  Musculoskeletal and Integumentary:  -- Wounds: None appreciated -- Extremities: B/L UE and LE FROM, hands and feet warm, no edema  Neurologic:  -- Motor function: intact and symmetric -- Sensation: intact and symmetric   Labs:  CBC Latest Ref Rng & Units 09/27/2020 09/12/2020 09/05/2020  WBC 4.0 - 10.5 K/uL 14.7(H) 13.2(H) 16.0(H)  Hemoglobin 12.0 - 15.0 g/dL 09/14/2020 09/07/2020 62.1  Hematocrit 36.0 - 46.0 % 39.5 38.8 38.5  Platelets 150 - 400 K/uL 325 327 325   CMP Latest Ref Rng & Units 09/27/2020 09/12/2020 09/05/2020  Glucose 70 - 99 mg/dL 96 95 09/14/2020)  BUN 6 - 20 mg/dL 9 7 14   Creatinine 0.44 - 1.00 mg/dL 09/07/2020 846(N  Sodium 135 - 145 mmol/L 138 141 136  Potassium 3.5 - 5.1 mmol/L 3.9 3.4(L) 3.6  Chloride 98 - 111 mmol/L 108 107 105  CO2 22 - 32 mmol/L 19(L) 24 22  Calcium 8.9 - 10.3 mg/dL 9.4 9.2 9.5  Total Protein 6.5 - 8.1 g/dL 8.3(H) 8.0 8.0  Total Bilirubin 0.3 - 1.2 mg/dL 0.8 0.5 0.3  Alkaline Phos 38 - 126 U/L 85 78 91  AST 15 - 41 U/L 18 14(L) 16  ALT 0 - 44 U/L 19 16 15     Imaging studies:  EXAM: ULTRASOUND ABDOMEN LIMITED RIGHT UPPER QUADRANT   COMPARISON:  09/05/2020   FINDINGS: Gallbladder:   Gallstones. No wall thickening visualized. No sonographic Murphy sign noted by sonographer.   Common bile duct:   Diameter: 3 mm   Liver:   No focal lesion identified. Within normal limits in parenchymal echogenicity. Portal vein is patent on color Doppler imaging with normal direction of blood flow towards the liver.   Other: None.   IMPRESSION: Cholelithiasis without sonographic evidence of  acute cholecystitis.     Electronically Signed   By: 5.28 M.D.   On: 09/27/2020 12:58  Assessment/Plan:  30 y.o. female with acute cholecystitis.  Patient with history, physical exam consistent with acute cholecystitis despite images not showing sign of gallbladder wall thickening no pericholecystic fluid.  The fact that the pain has not improved with morphine is consistent with acute cholecystitis.  Patient was scheduled for cholecystectomy morning but seems to return abdominal pain he will not be able to wait to, electively for the surgery.  We will admit to observation and proceed with cholecystectomy.  Patient oriented about diagnosis and surgical management as treatment.   Discussed the risk of surgery including post-op infxn, seroma, biloma, chronic pain, poor-delayed wound healing, retained gallstone, conversion to open procedure, post-op  SBO or ileus, and need for additional procedures to address said risks.  The risks of general anesthetic including MI, CVA, sudden death or even reaction to anesthetic medications also discussed. Alternatives include continued observation.  Benefits include possible symptom relief, prevention of complications including acute cholecystitis, pancreatitis.  Gae Gallop, MD

## 2020-09-27 NOTE — Op Note (Signed)
Preoperative diagnosis: Acute cholecystitis  Postoperative diagnosis: Acute cholecystitis  Procedure: Robotic Assisted Laparoscopic Cholecystectomy.   Anesthesia: GETA   Surgeon: Dr. Hazle Quant  Wound Classification: Clean Contaminated  Indications: Patient is a 30 y.o. female developed severe right upper quadrant pain, nausea, vomiting and leukocytosis and on workup was found to have cholelithiasis with non improving pain. Robotic Assisted Laparoscopic cholecystectomy was elected.  Findings: Impacted gallbladder on the gallbladder neck   Critical view of safety achieved   Cystic duct and artery identified, ligated and divided Adequate hemostasis  Description of procedure: The patient was placed on the operating table in the supine position. General anesthesia was induced. A time-out was completed verifying correct patient, procedure, site, positioning, and implant(s) and/or special equipment prior to beginning this procedure. An orogastric tube was placed. The abdomen was prepped and draped in the usual sterile fashion.  An incision was made in a natural skin line below the umbilicus.  The fascia was elevated and the Veress needle inserted. Proper position was confirmed by aspiration and saline meniscus test.  The abdomen was insufflated with carbon dioxide to a pressure of 15 mmHg. The patient tolerated insufflation well. A 8-mm trocar was then inserted in optiview fashion.  The laparoscope was inserted and the abdomen inspected. No injuries from initial trocar placement were noted. Additional trocars were then inserted in the following locations: an 8-mm trocar in the left lateral abdomen, and another two 8-mm trocars to the right side of the abdomen 5 cm appart. The umbilical trocar was changed to a 12 mm trocar all under direct visualization. The abdomen was inspected and no abnormalities were found. The table was placed in the reverse Trendelenburg position with the right side up.  The robotic arms were docked and target anatomy identified. Instrument inserted under direct visualization.  Filmy adhesions between the gallbladder and omentum, duodenum and transverse colon were lysed with electrocautery. The dome of the gallbladder was grasped with a prograsp and retracted over the dome of the liver. The infundibulum was also grasped with an atraumatic grasper and retracted toward the right lower quadrant. This maneuver exposed Calot's triangle. The peritoneum overlying the gallbladder infundibulum was then incised and the cystic duct and cystic artery identified and circumferentially dissected. Critical view of safety reviewed before ligating any structure. Firefly images taken to visualize biliary ducts. The cystic duct and cystic artery were then doubly clipped and divided close to the gallbladder.  The gallbladder was then dissected from its peritoneal attachments by electrocautery. Hemostasis was checked and the gallbladder and contained stones were removed using an endoscopic retrieval bag. The gallbladder was passed off the table as a specimen. The gallbladder fossa was copiously irrigated with saline and hemostasis was obtained. There was no evidence of bleeding from the gallbladder fossa or cystic artery or leakage of the bile from the cystic duct stump. Secondary trocars were removed under direct vision. No bleeding was noted. The robotic arms were undoked. The scope was withdrawn and the umbilical trocar removed. The abdomen was allowed to collapse. The fascia of the 11mm trocar sites was closed with figure-of-eight 0 vicryl sutures. The skin was closed with subcuticular sutures of 4-0 monocryl and topical skin adhesive. The orogastric tube was removed.  The patient tolerated the procedure well and was taken to the postanesthesia care unit in stable condition.   Specimen: Gallbladder  Complications: None  EBL: 5 mL

## 2020-09-27 NOTE — ED Notes (Signed)
RN to bedside to introduce self. Pt advised she is scheduled for OR to remove her gallbladder on Monday. She has been throwing up and it has worsened. She called surgeons office they advised to come to ER.

## 2020-09-27 NOTE — ED Triage Notes (Signed)
Pt reports that she is scheduled to have her GB out on Monday, she developed RUQ pain and nausea again today and called the surgeon and they told her to come here to be reevaluated.Pt reports that the medication they gave her is not working.

## 2020-09-27 NOTE — Anesthesia Preprocedure Evaluation (Signed)
Anesthesia Evaluation  Patient identified by MRN, date of birth, ID band Patient awake    Reviewed: Allergy & Precautions, H&P , NPO status , Patient's Chart, lab work & pertinent test results, reviewed documented beta blocker date and time   Airway Mallampati: IV  TM Distance: >3 FB Neck ROM: full    Dental  (+) Dental Advidsory Given, Missing, Teeth Intact Braces:   Pulmonary neg pulmonary ROS,    Pulmonary exam normal breath sounds clear to auscultation       Cardiovascular Exercise Tolerance: Good negative cardio ROS Normal cardiovascular exam Rhythm:regular Rate:Normal     Neuro/Psych negative neurological ROS  negative psych ROS   GI/Hepatic negative GI ROS, Neg liver ROS,   Endo/Other  negative endocrine ROS  Renal/GU negative Renal ROS  negative genitourinary   Musculoskeletal   Abdominal   Peds  Hematology negative hematology ROS (+)   Anesthesia Other Findings Past Medical History: No date: Medical history non-contributory   Reproductive/Obstetrics negative OB ROS                             Anesthesia Physical Anesthesia Plan  ASA: 1  Anesthesia Plan: General   Post-op Pain Management:    Induction: Intravenous  PONV Risk Score and Plan: 2 and Ondansetron, Dexamethasone, Midazolam, Treatment may vary due to age or medical condition and Promethazine  Airway Management Planned: Oral ETT  Additional Equipment:   Intra-op Plan:   Post-operative Plan: Extubation in OR  Informed Consent: I have reviewed the patients History and Physical, chart, labs and discussed the procedure including the risks, benefits and alternatives for the proposed anesthesia with the patient or authorized representative who has indicated his/her understanding and acceptance.     Dental Advisory Given  Plan Discussed with: Anesthesiologist, CRNA and Surgeon  Anesthesia Plan Comments:          Anesthesia Quick Evaluation

## 2020-09-27 NOTE — ED Provider Notes (Signed)
[facility] Emergency Department Provider Note   ____________________________________________   Event Date/Time   First MD Initiated Contact with Patient 09/27/20 1127     (approximate)  I have reviewed the triage vital signs and the nursing notes.   HISTORY  Chief Complaint Abdominal Pain and Nausea    HPI Lacey Clark is a 30 y.o. female with past medical history of cholelithiasis who presents to the ED complaining of abdominal pain.  Patient reports that she has been dealing with intermittent pain in the right upper quadrant of her abdomen as well as her epigastrium for the past couple of weeks.  She has been scheduled for a cholecystectomy in 3 days, but states that her pain and nausea seems to have worsened overnight.  She reports multiple episodes of vomiting this morning with sharp and burning pain in her right upper quadrant and epigastrium.  Pain will seem to radiate up into her chest, she has been taking Zofran and Protonix without relief.  She states her LMP was a couple of weeks ago and she denies any dysuria or flank pain.        Past Medical History:  Diagnosis Date   Medical history non-contributory     Patient Active Problem List   Diagnosis Date Noted   Indication for care in labor and delivery, antepartum 11/14/2018   Pregnancy 10/18/2018    Past Surgical History:  Procedure Laterality Date   NO PAST SURGERIES      Prior to Admission medications   Medication Sig Start Date End Date Taking? Authorizing Provider  dicyclomine (BENTYL) 20 MG tablet Take 1 tablet (20 mg total) by mouth every 8 (eight) hours as needed for spasms (Abdominal cramping). Patient not taking: Reported on 09/26/2020 09/05/20   Ward, Layla Maw, DO  ondansetron (ZOFRAN ODT) 4 MG disintegrating tablet Take 1 tablet (4 mg total) by mouth every 6 (six) hours as needed for nausea or vomiting. 09/05/20   Ward, Layla Maw, DO  pantoprazole (PROTONIX) 40 MG  tablet Take 1 tablet (40 mg total) by mouth daily. 09/05/20 11/04/20  Ward, Layla Maw, DO    Allergies Patient has no known allergies.  No family history on file.  Social History Social History   Tobacco Use   Smoking status: Never   Smokeless tobacco: Never  Vaping Use   Vaping Use: Never used  Substance Use Topics   Alcohol use: Never   Drug use: Never    Review of Systems  Constitutional: No fever/chills Eyes: No visual changes. ENT: No sore throat. Cardiovascular: Denies chest pain. Respiratory: Denies shortness of breath. Gastrointestinal: Positive for abdominal pain, nausea, and vomiting.  No diarrhea.  No constipation. Genitourinary: Negative for dysuria. Musculoskeletal: Negative for back pain. Skin: Negative for rash. Neurological: Negative for headaches, focal weakness or numbness.  ____________________________________________   PHYSICAL EXAM:  VITAL SIGNS: ED Triage Vitals [09/27/20 1114]  Enc Vitals Group     BP 122/85     Pulse Rate (!) 106     Resp 20     Temp 98.2 F (36.8 C)     Temp Source Oral     SpO2 100 %     Weight 174 lb (78.9 kg)     Height 5\' 3"  (1.6 m)     Head Circumference      Peak Flow      Pain Score 10     Pain Loc      Pain Edu?  Excl. in GC?     Constitutional: Alert and oriented. Eyes: Conjunctivae are normal. Head: Atraumatic. Nose: No congestion/rhinnorhea. Mouth/Throat: Mucous membranes are moist. Neck: Normal ROM Cardiovascular: Normal rate, regular rhythm. Grossly normal heart sounds. Respiratory: Normal respiratory effort.  No retractions. Lungs CTAB. Gastrointestinal: Soft and tender to palpation to the right upper quadrant and epigastrium with no rebound or guarding.  No distention. Genitourinary: deferred Musculoskeletal: No lower extremity tenderness nor edema. Neurologic:  Normal speech and language. No gross focal neurologic deficits are appreciated. Skin:  Skin is warm, dry and intact. No rash  noted. Psychiatric: Mood and affect are normal. Speech and behavior are normal.  ____________________________________________   LABS (all labs ordered are listed, but only abnormal results are displayed)  Labs Reviewed  COMPREHENSIVE METABOLIC PANEL - Abnormal; Notable for the following components:      Result Value   CO2 19 (*)    Total Protein 8.3 (*)    All other components within normal limits  CBC - Abnormal; Notable for the following components:   WBC 14.7 (*)    All other components within normal limits  RESP PANEL BY RT-PCR (FLU A&B, COVID) ARPGX2  LIPASE, BLOOD  URINALYSIS, COMPLETE (UACMP) WITH MICROSCOPIC  POC URINE PREG, ED    PROCEDURES  Procedure(s) performed (including Critical Care):  Procedures   ____________________________________________   INITIAL IMPRESSION / ASSESSMENT AND PLAN / ED COURSE      30 year old female with past medical history of cholelithiasis presents to the ED with worsening right upper quadrant and epigastric pain radiating up into her chest since last night.  Pain is reproduced with palpation of her right upper quadrant and we will further assess with ultrasound to ensure she has not developed cholecystitis.  We will check labs including LFTs and lipase, treat symptomatically with IV morphine and Zofran.  She does describe a burning component of pain moving up into her chest and reflux may be contributing to her symptoms as well.  Labs are unremarkable, right upper quadrant ultrasound shows cholelithiasis without evidence of cholecystitis.  Patient continues to have significant pain on reassessment, she was evaluated by general surgery who will plan to take out her gallbladder today.      ____________________________________________   FINAL CLINICAL IMPRESSION(S) / ED DIAGNOSES  Final diagnoses:  RUQ pain  Biliary colic     ED Discharge Orders     None        Note:  This document was prepared using Dragon voice  recognition software and may include unintentional dictation errors.    Chesley Noon, MD 09/27/20 [phone]

## 2020-09-27 NOTE — Anesthesia Procedure Notes (Signed)
Procedure Name: Intubation Date/Time: 09/27/2020 5:10 PM Performed by: Nelda Marseille, CRNA Pre-anesthesia Checklist: Patient identified, Patient being monitored, Timeout performed, Emergency Drugs available and Suction available Patient Re-evaluated:Patient Re-evaluated prior to induction Oxygen Delivery Method: Circle system utilized Preoxygenation: Pre-oxygenation with 100% oxygen Induction Type: IV induction Ventilation: Mask ventilation without difficulty Laryngoscope Size: Mac, 3 and McGraph Grade View: Grade I Tube type: Oral Tube size: 6.5 mm Number of attempts: 1 Airway Equipment and Method: Stylet Placement Confirmation: ETT inserted through vocal cords under direct vision, positive ETCO2 and breath sounds checked- equal and bilateral Secured at: 21 cm Tube secured with: Tape Dental Injury: Teeth and Oropharynx as per pre-operative assessment

## 2020-09-27 NOTE — Transfer of Care (Signed)
Immediate Anesthesia Transfer of Care Note  Patient: Lacey Clark  Procedure(s) Performed: XI ROBOTIC ASSISTED LAPAROSCOPIC CHOLECYSTECTOMY (Abdomen)  Patient Location: PACU  Anesthesia Type:General  Level of Consciousness: drowsy  Airway & Oxygen Therapy: Patient Spontanous Breathing and Patient connected to face mask oxygen  Post-op Assessment: Report given to RN and Post -op Vital signs reviewed and stable  Post vital signs: Reviewed and stable  Last Vitals:  Vitals Value Taken Time  BP 131/94 09/27/20 1830  Temp    Pulse 87 09/27/20 1833  Resp 23 09/27/20 1833  SpO2 100 % 09/27/20 1833  Vitals shown include unvalidated device data.  Last Pain:  Vitals:   09/27/20 1511  TempSrc: Oral  PainSc: 8          Complications: No notable events documented.

## 2020-09-28 DIAGNOSIS — K8012 Calculus of gallbladder with acute and chronic cholecystitis without obstruction: Secondary | ICD-10-CM | POA: Diagnosis not present

## 2020-09-28 MED ORDER — NABUMETONE 500 MG PO TABS: 500.0000 mg | ORAL_TABLET | Freq: Two times a day (BID) | ORAL | 0 refills | Status: AC

## 2020-09-28 MED ORDER — HYDROCODONE-ACETAMINOPHEN 5-325 MG PO TABS: 1.0000 | ORAL_TABLET | ORAL | 0 refills | Status: AC | PRN

## 2020-09-28 NOTE — Discharge Instructions (Signed)

## 2020-09-28 NOTE — Discharge Summary (Signed)
  Patient ID: Lacey Clark MRN: [id number] DOB/AGE: 08-13-1990 30 y.o.  Admit date: 09/27/2020 Discharge date: 09/28/2020   Discharge Diagnoses:  Active Problems:   Acute cholecystitis   Procedures: Robotic assisted laparoscopic cholecystectomy  Hospital Course: Patient came to the ED with severe abdominal pain.  Patient with and cholelithiasis.  Ultrasound confirmed again cholelithiasis.  Patient with leukocytosis.  Pain unable to be controlled with morphine.  Patient underwent robotic assisted laparoscopic cholecystectomy.  Patient tolerated the procedure well.  This morning patient tolerating diet.  Pain controlled with pain medications.  Wounds are dry and clean.  Ambulating.  Physical Exam Cardiovascular:     Rate and Rhythm: Normal rate and regular rhythm.  Pulmonary:     Effort: Pulmonary effort is normal.     Breath sounds: Normal breath sounds.  Abdominal:     General: Abdomen is flat. Bowel sounds are normal.     Palpations: Abdomen is soft.  Neurological:     Mental Status: She is alert and oriented to person, place, and time.     Consults: None  Disposition: Discharge disposition: 01-Home or Self Care       Discharge Instructions     Diet - low sodium heart healthy   Complete by: As directed    Increase activity slowly   Complete by: As directed       Allergies as of 09/28/2020   No Known Allergies      Medication List     TAKE these medications    HYDROcodone-acetaminophen 5-325 MG tablet Commonly known as: Norco Take 1 tablet by mouth every 4 (four) hours as needed for up to 3 days for moderate pain.   nabumetone 500 MG tablet Commonly known as: RELAFEN Take 1 tablet (500 mg total) by mouth 2 (two) times daily for 5 days.   ondansetron 4 MG disintegrating tablet Commonly known as: Zofran ODT Take 1 tablet (4 mg total) by mouth every 6 (six) hours as needed for nausea or vomiting.   pantoprazole 40 MG tablet Commonly known as:  Protonix Take 1 tablet (40 mg total) by mouth daily.       ASK your doctor about these medications    dicyclomine 20 MG tablet Commonly known as: BENTYL Take 1 tablet (20 mg total) by mouth every 8 (eight) hours as needed for spasms (Abdominal cramping).        Follow-up Information     Carolan Shiver, MD Follow up in 2 week(s).   Specialty: General Surgery Contact information: [address] [place] [address] [phone]

## 2020-09-28 NOTE — Progress Notes (Signed)
Pt's PIV removed with tip intact. Discharge instructions explained with pt. Pt denies any questions or concerns. Pt to pick up meds from home pharmacy.   

## 2020-09-28 NOTE — Anesthesia Postprocedure Evaluation (Signed)
Anesthesia Post Note  Patient: Lacey Clark  Procedure(s) Performed: XI ROBOTIC ASSISTED LAPAROSCOPIC CHOLECYSTECTOMY (Abdomen)  Patient location during evaluation: PACU Anesthesia Type: General Level of consciousness: awake and alert Pain management: pain level controlled Vital Signs Assessment: post-procedure vital signs reviewed and stable Respiratory status: spontaneous breathing, nonlabored ventilation, respiratory function stable and patient connected to nasal cannula oxygen Cardiovascular status: blood pressure returned to baseline and stable Postop Assessment: no apparent nausea or vomiting Anesthetic complications: no   No notable events documented.   Last Vitals:  Vitals:   09/27/20 2038 09/28/20 0429  BP: 107/62 97/61  Pulse: 78 76  Resp: 20 18  Temp: 37.1 C 37.1 C  SpO2: 98% 100%    Last Pain:  Vitals:   09/28/20 0429  TempSrc: Oral  PainSc:                  Lenard Simmer

## 2020-09-30 ENCOUNTER — Ambulatory Visit: Admission: RE | Admit: 2020-09-30 | Payer: 59 | Source: Home / Self Care | Admitting: General Surgery

## 2020-10-01 LAB — SURGICAL PATHOLOGY

## 2021-12-01 ENCOUNTER — Emergency Department
Admission: EM | Admit: 2021-12-01 | Discharge: 2021-12-01 | Disposition: A | Discharge status: Home / Self Care | Payer: Medicaid Other | Attending: Emergency Medicine | Admitting: Emergency Medicine

## 2021-12-01 ENCOUNTER — Other Ambulatory Visit: Payer: Self-pay

## 2021-12-01 DIAGNOSIS — H579 Unspecified disorder of eye and adnexa: Secondary | ICD-10-CM

## 2021-12-01 DIAGNOSIS — H5789 Other specified disorders of eye and adnexa: Secondary | ICD-10-CM | POA: Insufficient documentation

## 2021-12-01 NOTE — ED Provider Notes (Signed)
[facility] Provider Note    Event Date/Time   First MD Initiated Contact with Patient 12/01/21 2133     (approximate)   History   Eye Problem   HPI  Lacey Clark is a 31 y.o. female with no significant past medical history who presents with a growth on her right eye.  Last week patient noticed something hanging down from the right eye.  She went to her primary doctor they gave her lubricating eyedrops and to follow-up in a week and that she may need to be referred to ophthalmology.  Did not really change over the week and then today it seemed to get worse.  While in the waiting room a piece of skin came off of the eye.  She noticed that it is red on the inner surface of the upper right eyelid.  Denies vision change she does not wear contacts no trauma to the eye.     Past Medical History:  Diagnosis Date   Medical history non-contributory     Patient Active Problem List   Diagnosis Date Noted   Acute cholecystitis 09/27/2020   Indication for care in labor and delivery, antepartum 11/14/2018   Pregnancy 10/18/2018     Physical Exam  Triage Vital Signs: ED Triage Vitals  Enc Vitals Group     BP 12/01/21 1941 120/77     Pulse Rate 12/01/21 1941 96     Resp 12/01/21 1941 18     Temp 12/01/21 1941 98.3 F (36.8 C)     Temp Source 12/01/21 1941 Oral     SpO2 12/01/21 1941 100 %     Weight 12/01/21 1944 173 lb (78.5 kg)     Height --      Head Circumference --      Peak Flow --      Pain Score 12/01/21 1944 0     Pain Loc --      Pain Edu? --      Excl. in GC? --     Most recent vital signs: Vitals:   12/01/21 1941  BP: 120/77  Pulse: 96  Resp: 18  Temp: 98.3 F (36.8 C)  SpO2: 100%     General: Awake, no distress.  CV:  Good peripheral perfusion.  Resp:  Normal effort.  Abd:  No distention.  Neuro:             Awake, Alert, Oriented x 3  Other:  PERRLA, EOMI Conjunctiva is clear bilaterally The right upper inner  eyelid just lateral to the medial canthus has a redundant area of mucosal tissue, it is not visible without lifting the eyelid does not protrude into the visual axis   ED Results / Procedures / Treatments  Labs (all labs ordered are listed, but only abnormal results are displayed) Labs Reviewed - No data to display   EKG     RADIOLOGY    PROCEDURES:  Critical Care performed: No  Procedures   MEDICATIONS ORDERED IN ED: Medications - No data to display   IMPRESSION / MDM / ASSESSMENT AND PLAN / ED COURSE  I reviewed the triage vital signs and the nursing notes.                              Patient's presentation is most consistent with acute complicated illness / injury requiring diagnostic workup. Patient is a 31 year old female presenting with an abnormality of  the right eye.  She noticed this last week felt like there was something dangling into her vision and saw her primary doctor who prescribed a lubricating eyedrop.  While in the waiting room tonight the patient had a piece of skin, off of the upper eyelid.  She denies pain or vision loss it is just itchy.  She has no history of similar no trauma wears glasses but no contacts.  On exam when the patient is not lifting her eyelid there is no visible abnormality the conjunctiva and globe appear normal there is no swelling of the external lids no hordeolum or chalazion.  The lashes are normal.  With eversion of the upper eyelid just lateral to the medial canthus on the upper eyelid there is a somewhat redundant area of mucosal tissue.  Unclear what exactly this is but I have low suspicion that it is urgent or life-threatening to her vision or globe.  Does not look to be infectious to me.  Recommended that she continue the lubricating eyedrops as there could be a component of irritation from dry eye.  Recommended that she follow-up with ophthalmology patient given contact information for [facility].       FINAL  CLINICAL IMPRESSION(S) / ED DIAGNOSES   Final diagnoses:  Eye problem     Rx / DC Orders   ED Discharge Orders     None        Note:  This document was prepared using Dragon voice recognition software and may include unintentional dictation errors.   Rada Hay, MD 12/01/21 2209

## 2021-12-01 NOTE — Discharge Instructions (Addendum)
Please schedule an appointment with the eye doctor.  Return to emergency department for swelling or loss of vision. Continue to take your eye drops.

## 2021-12-01 NOTE — ED Notes (Signed)
Pt reports while sitting in lobby she had small fleshy area fall off side of eye, and has somewhat improved sx.

## 2021-12-01 NOTE — ED Triage Notes (Signed)
Pt arrives with c/o right eye growth that has been there for about a week. Per pt, the growth changed colors to dark red.

## 2021-12-01 NOTE — ED Provider Triage Note (Signed)
  Emergency Medicine Provider Triage Evaluation Note  [place] , a 31 y.o.female,  was evaluated in triage.  Pt complains of eye problem.  Patient states that she had a pink, fleshy type growth on her right eye that she was previously given eyedrops for.  However, today she notes that it started turning blood red.  She states that it is itchy, but otherwise does not bother her.   Review of Systems  Positive: Eye growth Negative: Denies fever, chest pain, vomiting  Physical Exam   Vitals:   12/01/21 1941  BP: 120/77  Pulse: 96  Resp: 18  Temp: 98.3 F (36.8 C)  SpO2: 100%   Gen:   Awake, no distress   Resp:  Normal effort  MSK:   Moves extremities without difficulty  Other:  Fleshy, blood-tinged growth extending from the medial canthus.  Medical Decision Making  Given the patient's initial medical screening exam, the following diagnostic evaluation has been ordered. The patient will be placed in the appropriate treatment space, once one is available, to complete the evaluation and treatment. I have discussed the plan of care with the patient and I have advised the patient that an ED physician or mid-level practitioner will reevaluate their condition after the test results have been received, as the results may give them additional insight into the type of treatment they may need.    Diagnostics: None immediately.  Treatments: none immediately   Teodoro Spray, [place] 12/01/21 1946

## 2021-12-01 NOTE — ED Notes (Signed)
Pt dc to home. Dc instructions reviewed with all questions answered. Pt verbalizes understanding. Pt ambulatory out of dept with steady gait 

## 2022-08-10 IMAGING — US US ABDOMEN LIMITED
1 series · 15 of 25 positions shown · non-contrast
Comparison: 09/05/2020

CLINICAL DATA: Right upper quadrant pain

EXAM:
ULTRASOUND ABDOMEN LIMITED RIGHT UPPER QUADRANT

[Series 1: us abdomen limited ruq · 15 of 39 slices shown]
[im 1/39]
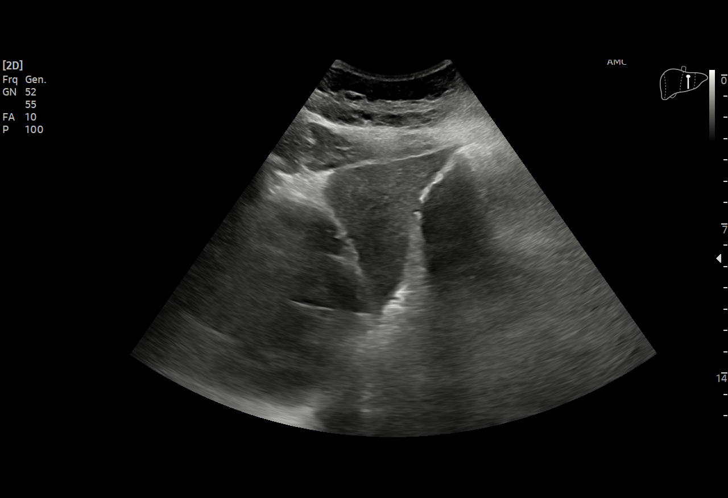
[im 4/39]
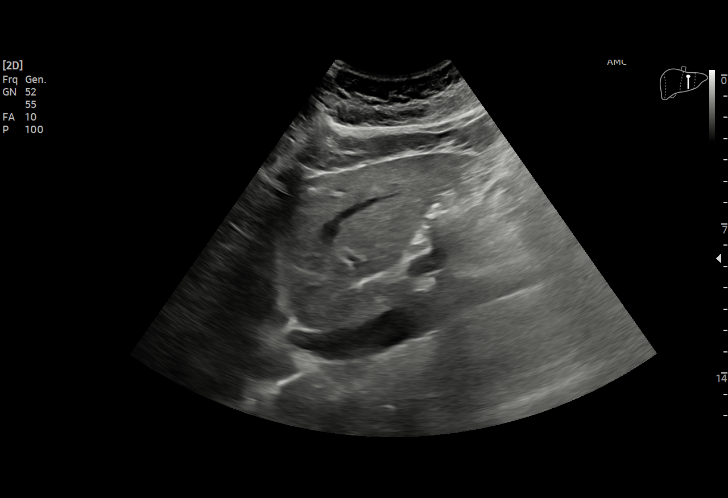
[im 7/39]
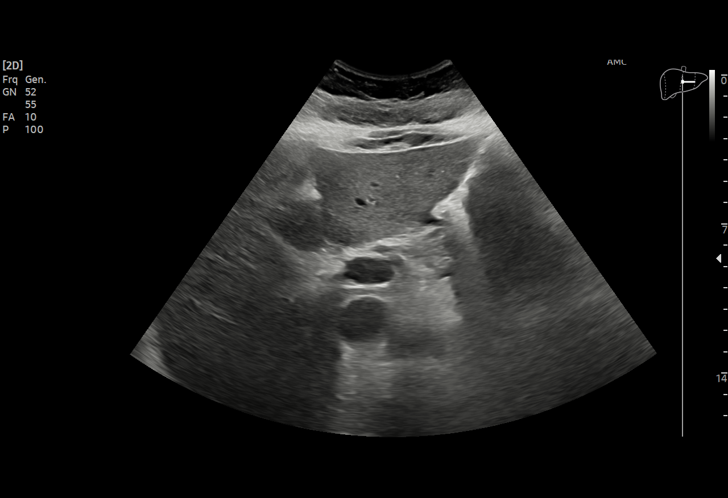
[im 8/39]
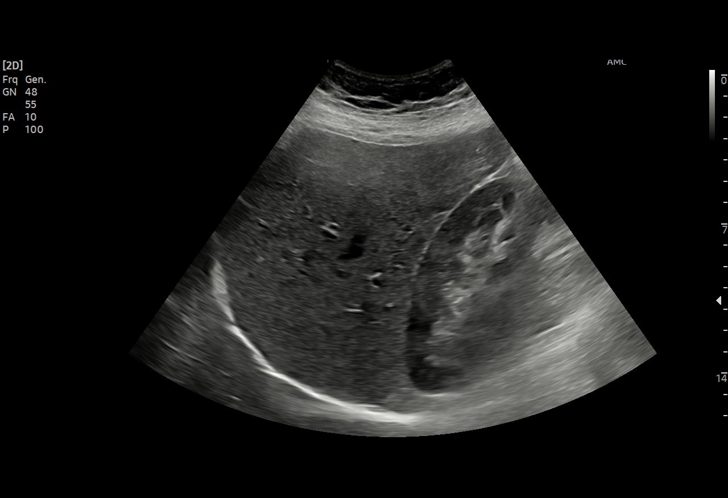
[im 12/39]
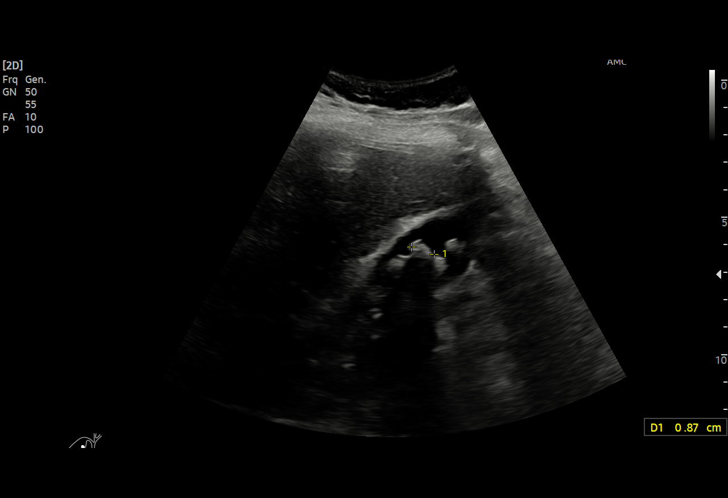
[im 15/39]
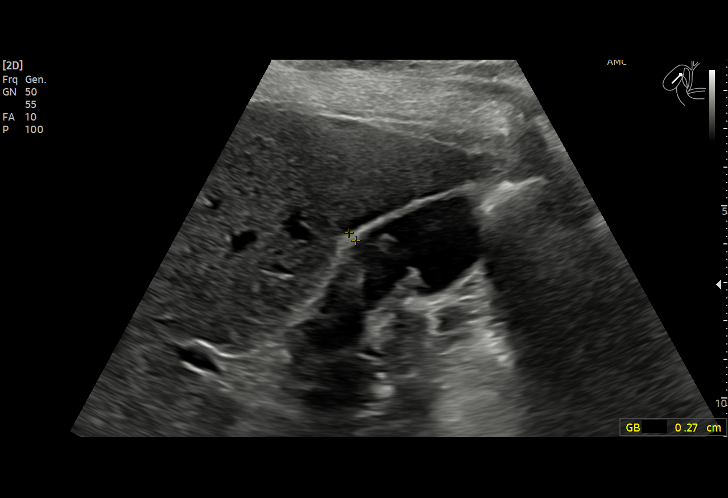
[im 16/39]
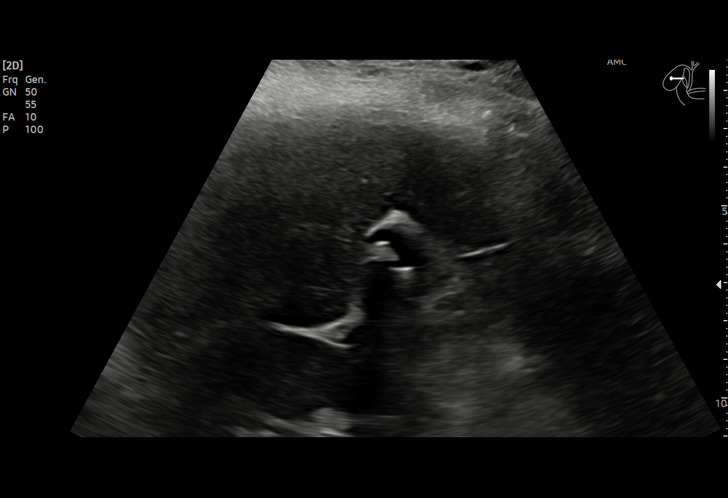
[im 20/39]
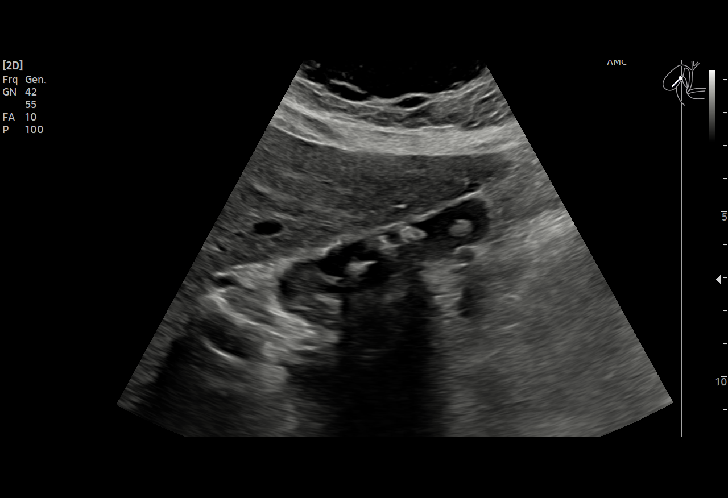
[im 23/39]
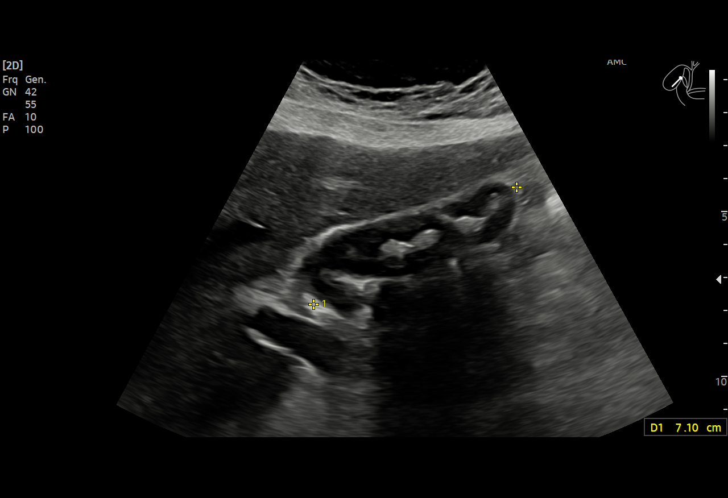
[im 24/39]
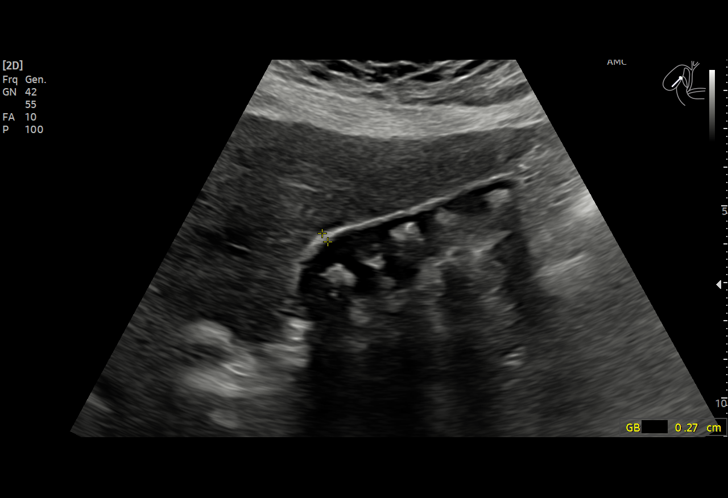
[im 27/39]
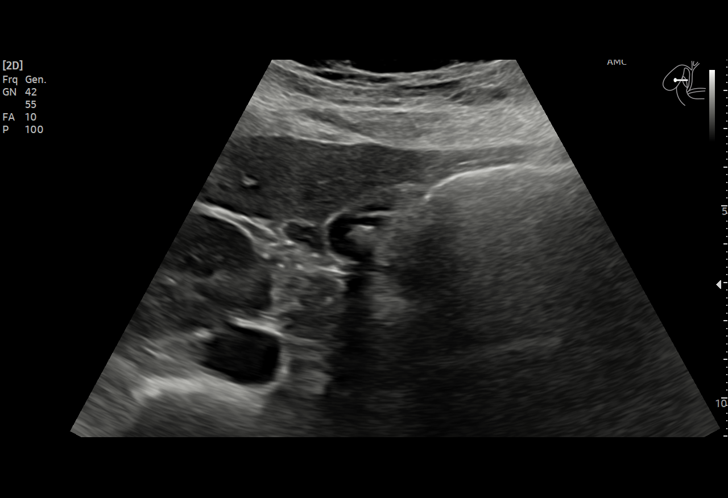
[im 31/39]
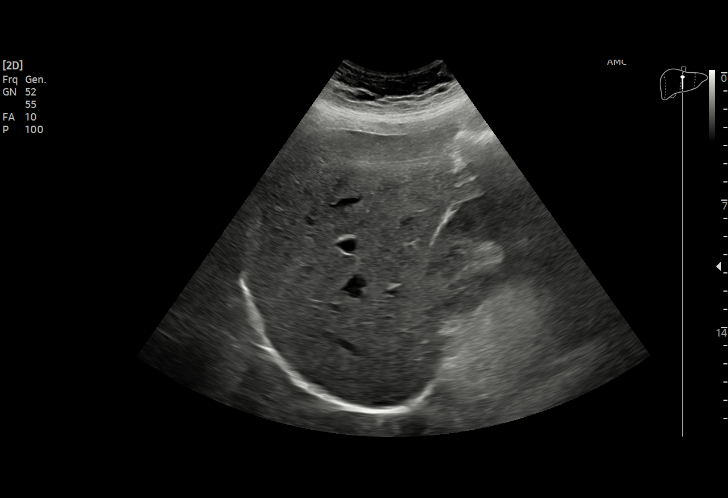
[im 32/39]
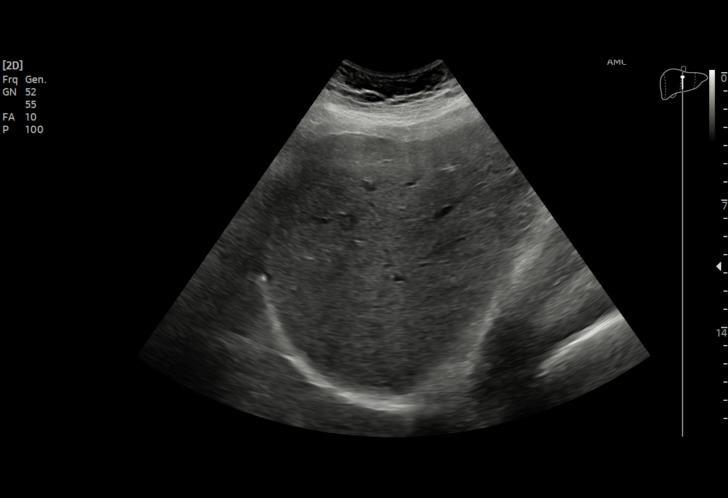
[im 35/39]
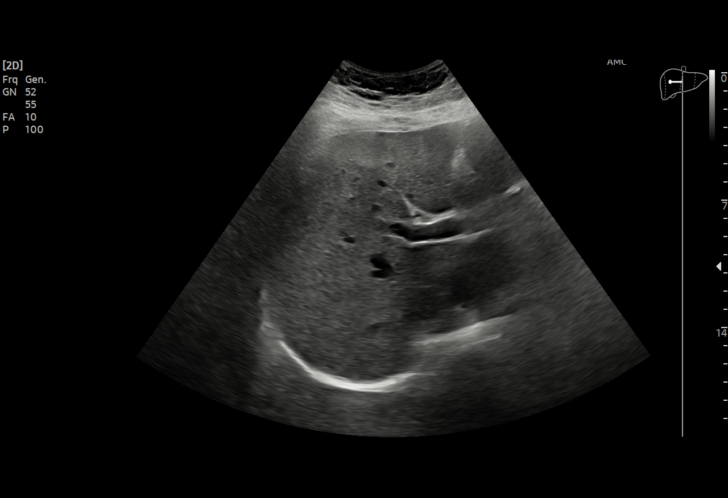
[im 39/39]
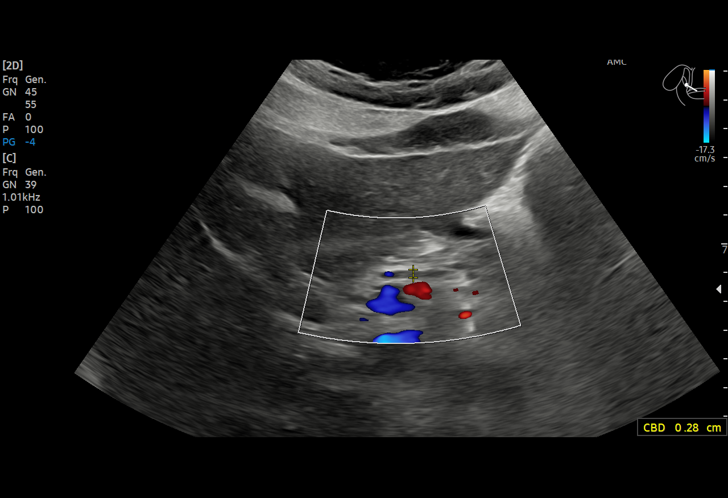

[15 of 25 positions shown; findings below may reference images not displayed]

FINDINGS: Gallbladder:

Gallstones. No wall thickening visualized. No sonographic Murphy
sign noted by sonographer.

Common bile duct:

Diameter: 3 mm

Liver:

No focal lesion identified. Within normal limits in parenchymal
echogenicity. Portal vein is patent on color Doppler imaging with
normal direction of blood flow towards the liver.

Other: None.
IMPRESSION: Cholelithiasis without sonographic evidence of acute cholecystitis.
